# Patient Record
Sex: Male | Born: 1975 | Race: White | Hispanic: No | Marital: Married | Smoking: Former smoker
Health system: Southern US, Community
[De-identification: ages and names within clinical notes are randomized; demographics above are authoritative.]

## PROBLEM LIST (undated history)

## (undated) DIAGNOSIS — T7840XA Allergy, unspecified, initial encounter: Secondary | ICD-10-CM

## (undated) DIAGNOSIS — K8020 Calculus of gallbladder without cholecystitis without obstruction: Secondary | ICD-10-CM

## (undated) DIAGNOSIS — R519 Headache, unspecified: Secondary | ICD-10-CM

## (undated) DIAGNOSIS — K589 Irritable bowel syndrome without diarrhea: Secondary | ICD-10-CM

## (undated) DIAGNOSIS — Z800 Family history of malignant neoplasm of digestive organs: Secondary | ICD-10-CM

## (undated) DIAGNOSIS — F419 Anxiety disorder, unspecified: Secondary | ICD-10-CM

## (undated) DIAGNOSIS — B019 Varicella without complication: Secondary | ICD-10-CM

## (undated) DIAGNOSIS — J309 Allergic rhinitis, unspecified: Secondary | ICD-10-CM

## (undated) DIAGNOSIS — G8929 Other chronic pain: Secondary | ICD-10-CM

## (undated) DIAGNOSIS — E785 Hyperlipidemia, unspecified: Secondary | ICD-10-CM

## (undated) DIAGNOSIS — K219 Gastro-esophageal reflux disease without esophagitis: Secondary | ICD-10-CM

## (undated) HISTORY — PX: CHOLECYSTECTOMY: SHX55

## (undated) HISTORY — DX: Allergic rhinitis, unspecified: J30.9

## (undated) HISTORY — DX: Headache, unspecified: R51.9

## (undated) HISTORY — DX: Calculus of gallbladder without cholecystitis without obstruction: K80.20

## (undated) HISTORY — DX: Anxiety disorder, unspecified: F41.9

## (undated) HISTORY — PX: COLONOSCOPY: SHX174

## (undated) HISTORY — PX: ROTATOR CUFF REPAIR: SHX139

## (undated) HISTORY — DX: Family history of malignant neoplasm of digestive organs: Z80.0

## (undated) HISTORY — DX: Varicella without complication: B01.9

## (undated) HISTORY — DX: Irritable bowel syndrome without diarrhea: K58.9

## (undated) HISTORY — DX: Other chronic pain: G89.29

## (undated) HISTORY — DX: Hyperlipidemia, unspecified: E78.5

## (undated) HISTORY — PX: FOOT SURGERY: SHX648

## (undated) HISTORY — DX: Allergy, unspecified, initial encounter: T78.40XA

---

## 2005-04-26 ENCOUNTER — Emergency Department (HOSPITAL_COMMUNITY): Admission: EM | Admit: 2005-04-26 | Discharge: 2005-04-27 | Payer: Self-pay | Admitting: Emergency Medicine

## 2006-03-15 ENCOUNTER — Emergency Department (HOSPITAL_COMMUNITY): Admission: EM | Admit: 2006-03-15 | Discharge: 2006-03-15 | Payer: Self-pay | Admitting: Family Medicine

## 2007-05-10 ENCOUNTER — Ambulatory Visit (HOSPITAL_COMMUNITY): Admission: RE | Admit: 2007-05-10 | Discharge: 2007-05-10 | Payer: Self-pay | Admitting: Sports Medicine

## 2007-06-04 ENCOUNTER — Ambulatory Visit (HOSPITAL_BASED_OUTPATIENT_CLINIC_OR_DEPARTMENT_OTHER): Admission: RE | Admit: 2007-06-04 | Discharge: 2007-06-04 | Payer: Self-pay | Admitting: Orthopedic Surgery

## 2008-11-25 ENCOUNTER — Emergency Department (HOSPITAL_COMMUNITY): Admission: EM | Admit: 2008-11-25 | Discharge: 2008-11-25 | Payer: Self-pay | Admitting: Emergency Medicine

## 2008-12-09 ENCOUNTER — Ambulatory Visit (HOSPITAL_COMMUNITY): Admission: RE | Admit: 2008-12-09 | Discharge: 2008-12-09 | Payer: Self-pay | Admitting: Orthopedic Surgery

## 2009-03-23 ENCOUNTER — Ambulatory Visit (HOSPITAL_BASED_OUTPATIENT_CLINIC_OR_DEPARTMENT_OTHER): Admission: RE | Admit: 2009-03-23 | Discharge: 2009-03-23 | Payer: Self-pay | Admitting: Orthopedic Surgery

## 2010-06-20 ENCOUNTER — Encounter: Payer: Self-pay | Admitting: Orthopedic Surgery

## 2010-09-01 LAB — POCT HEMOGLOBIN-HEMACUE: Hemoglobin: 14.6 g/dL (ref 13.0–17.0)

## 2010-09-05 LAB — POCT RAPID STREP A (OFFICE): Streptococcus, Group A Screen (Direct): NEGATIVE

## 2010-10-11 NOTE — Op Note (Signed)
[name], [name]            ACCOUNT NO.:  [id number]   MEDICAL RECORD NO.:  [id number]          PATIENT TYPE:  AMB   LOCATION:  DSC                          FACILITY:  [facility]   PHYSICIAN:  [name], M.D. DATE OF BIRTH:  1975-06-03   DATE OF PROCEDURE:  06/04/2007  DATE OF DISCHARGE:                               OPERATIVE REPORT   PREOPERATIVE DIAGNOSIS:  Left shoulder partial labrum tear with possible  rotator cuff tear with impingement.   POSTOPERATIVE DIAGNOSES:  1. Left shoulder rotator cuff tear.  2. Left shoulder partial labrum tear with impingement.   OPERATION PERFORMED:  1. Left shoulder examination under anesthesia followed by      arthroscopically assisted rotator cuff repair using Arthrex suture      anchor x1.  2. Left shoulder debridement, partial labrum tear.  3. Left shoulder subacromial decompression.   SURGEON:  [name]. Thurston Kelly, M.D.   ASSISTANT:  [name], P.A.   ANESTHESIA:  General.   OPERATIVE TIME:  One hour.   COMPLICATIONS:  None.   INDICATIONS FOR PROCEDURE:  [name] is a 35 year old gentleman who has  had significant left shoulder pain for the past four to five months  increasing in nature with exam and MRI documenting possible labrum tear  with rotator cuff partial versus complete tear with impingement.  He has  failed conservative care and is now to undergo arthroscopy.   DESCRIPTION OF PROCEDURE:  [name] is brought to the operating room on  June 04, 2007 after an interscalene block was placed in the holding  room by anesthesia.  He was placed on the operating table in supine  position.  He received Ancef 1 g IV preoperatively for prophylaxis.  After being placed under general anesthesia, his left shoulder was  examined.  He had full range of motion of the shoulder with stable  ligamentous exam.  He was then placed in the beach chair position and  the shoulder and arm were prepped using sterile DuraPrep and  draped  using sterile technique.  Originally through a posterior arthroscopic  portal, the arthroscope with a pump attached was placed, through an  anterior portal and an arthroscopic probe was placed.  On initial  inspection, the articular cartilage in the glenohumeral joint was  intact.  Anterior labrum was intact.  Anterior inferior glenohumeral  ligament complex was intact.  Superior labrum showed mild fraying 25%  which was debrided but the biceps tendon anchor and biceps tendon was  intact.  The posterior labrum was intact.  The rotator cuff showed a  near complete tear of the supraspinatus 90%, partial tear of 30 to 40%  on the infraspinatus which was debrided, partial tear of the  subscapularis 30% which was debrided.  The teres minor was intact, the  inferior capsule recess was free of pathology.  Subacromial space was  entered and a lateral arthroscopic portal was made.  Moderately  thickened bursitis was resected.  Impingement was noted and the  subacromial decompression was carried out removing 6 to 8 mm of the  undersurface of the anterior, anterolateral and anteromedial acromion.  CA ligament release carried out as well.  The acromioclavicular joint  was not impinging on motion and thus was not resected.  The rotator cuff  tear was further debrided subacromially arthroscopically.  At this point  one Arthrex 5.5 mm suture anchor was placed in the greater tuberosity.  Each of the sutures in this anchor was passed in a mattress suture  technique through the rotator cuff tear and then tied down  arthroscopically with a firm and tight repair.  After this was done, the  shoulder could be brought through a full range of motion with no  impingement on the rotator cuff repair.  At this point it was felt that  all pathology had been satisfactorily addressed.  The instruments were  removed.  Portals were closed with 3-0 nylon suture and sterile  dressings and Kling applied and the  patient awakened and taken to  recovery in stable condition.   FOLLOWUP:  [name] will be followed as an outpatient on Percocet  and Robaxin with early physical therapy.  See him back in the office in  a week for sutures out and follow-up.      [name], M.D.  Electronically Signed     RAW/MEDQ  D:  06/04/2007  T:  06/04/2007  Job:  [id number]

## 2013-01-07 ENCOUNTER — Ambulatory Visit (INDEPENDENT_AMBULATORY_CARE_PROVIDER_SITE_OTHER): Payer: 59 | Admitting: Physician Assistant

## 2013-01-07 VITALS — BP 124/70 | HR 50 | Temp 98.0°F | Resp 16 | Ht 72.5 in | Wt 251.0 lb

## 2013-01-07 DIAGNOSIS — J209 Acute bronchitis, unspecified: Secondary | ICD-10-CM

## 2013-01-07 DIAGNOSIS — R05 Cough: Secondary | ICD-10-CM

## 2013-01-07 MED ORDER — ALBUTEROL SULFATE HFA 108 (90 BASE) MCG/ACT IN AERS: 2.0000 | INHALATION_SPRAY | RESPIRATORY_TRACT | Status: DC | PRN

## 2013-01-07 MED ORDER — BENZONATATE 100 MG PO CAPS: 100.0000 mg | ORAL_CAPSULE | Freq: Three times a day (TID) | ORAL | Status: DC | PRN

## 2013-01-07 MED ORDER — HYDROCODONE-HOMATROPINE 5-1.5 MG/5ML PO SYRP: 5.0000 mL | ORAL_SOLUTION | Freq: Three times a day (TID) | ORAL | Status: DC | PRN

## 2013-01-07 NOTE — Patient Instructions (Addendum)
Begin using the Tessalon Perles every 8 hours as needed for cough during the day.  Hycodan syrup for cough at night (this will likely make you sleepy).  Use the albuterol inhaler if needed for coughing spasms.  Make sure you are drinking plenty of fluids.  You may continue the Mucinex and Allegra.  Please let us know if any symptoms are worsening or not improving - i.e. Fever, worsening cough, shortness of breath, new symptoms.   Bronchitis Bronchitis is the body's way of reacting to injury and/or infection (inflammation) of the bronchi. Bronchi are the air tubes that extend from the windpipe into the lungs. If the inflammation becomes severe, it may cause shortness of breath. CAUSES  Inflammation may be caused by:  A virus.  Germs (bacteria).  Dust.  Allergens.  Pollutants and many other irritants. The cells lining the bronchial tree are covered with tiny hairs (cilia). These constantly beat upward, away from the lungs, toward the mouth. This keeps the lungs free of pollutants. When these cells become too irritated and are unable to do their job, mucus begins to develop. This causes the characteristic cough of bronchitis. The cough clears the lungs when the cilia are unable to do their job. Without either of these protective mechanisms, the mucus would settle in the lungs. Then you would develop pneumonia. Smoking is a common cause of bronchitis and can contribute to pneumonia. Stopping this habit is the single most important thing you can do to help yourself. TREATMENT   Your caregiver may prescribe an antibiotic if the cough is caused by bacteria. Also, medicines that open up your airways make it easier to breathe. Your caregiver may also recommend or prescribe an expectorant. It will loosen the mucus to be coughed up. Only take over-the-counter or prescription medicines for pain, discomfort, or fever as directed by your caregiver.  Removing whatever causes the problem (smoking, for  example) is critical to preventing the problem from getting worse.  Cough suppressants may be prescribed for relief of cough symptoms.  Inhaled medicines may be prescribed to help with symptoms now and to help prevent problems from returning.  For those with recurrent (chronic) bronchitis, there may be a need for steroid medicines. SEEK IMMEDIATE MEDICAL CARE IF:   During treatment, you develop more pus-like mucus (purulent sputum).  You have a fever.  Your baby is older than 3 months with a rectal temperature of 102 F (38.9 C) or higher.  Your baby is 79 months old or younger with a rectal temperature of 100.4 F (38 C) or higher.  You become progressively more ill.  You have increased difficulty breathing, wheezing, or shortness of breath. It is necessary to seek immediate medical care if you are elderly or sick from any other disease. MAKE SURE YOU:   Understand these instructions.  Will watch your condition.  Will get help right away if you are not doing well or get worse. Document Released: 05/15/2005 Document Revised: 08/07/2011 Document Reviewed: 03/24/2008 [facility] Patient Information 2014 [place], [place].

## 2013-01-07 NOTE — Progress Notes (Signed)
  Subjective:    Patient ID: [name], male    DOB: Dec 03, 1975, 37 y.o.   MRN: [id number]  HPI   [name] is a very pleasant 37 yr old male here with concern for 3 wks of cough.  Reports he had a little runny nose at the onset of illness - that has resolved but cough persists.  He denies fever or chills.  Cough is mostly non-productive - every now and then coughs up a little mucus.  Denies SOB or wheezing but does get in coughing fits. The cough is often worse at night, occasionally keeps him awake.  No hx of asthma or reflux.  Admits that there is "something going around at work."  Has used Data processing manager for symptoms with some relief.     Review of Systems  Constitutional: Negative for fever and chills.  HENT: Negative for ear pain, congestion, sore throat and rhinorrhea.   Respiratory: Positive for cough. Negative for chest tightness, shortness of breath and wheezing.   Cardiovascular: Negative.   Gastrointestinal: Negative.   Musculoskeletal: Negative.   Skin: Negative.   Neurological: Negative.        Objective:   Physical Exam  Vitals reviewed. Constitutional: He is oriented to person, place, and time. He appears well-developed and well-nourished. No distress.  HENT:  Head: Normocephalic and atraumatic.  Right Ear: Tympanic membrane and ear canal normal.  Left Ear: Tympanic membrane and ear canal normal.  Mouth/Throat: Uvula is midline, oropharynx is clear and moist and mucous membranes are normal.  Eyes: Conjunctivae are normal. No scleral icterus.  Neck: Neck supple.  Cardiovascular: Normal rate, regular rhythm and normal heart sounds.   Pulmonary/Chest: Effort normal and breath sounds normal. He has no wheezes. He has no rhonchi. He has no rales.  Increased cough with deep insp, but lungs CTA  Abdominal: Soft. There is no tenderness.  Lymphadenopathy:    He has no cervical adenopathy.  Neurological: He is alert and oriented to person, place, and time.   Skin: Skin is warm and dry.  Psychiatric: He has a normal mood and affect. His behavior is normal.        Assessment & Plan:  Acute bronchitis - Plan: benzonatate (TESSALON) 100 MG capsule, albuterol (PROVENTIL HFA;VENTOLIN HFA) 108 (90 BASE) MCG/ACT inhaler, HYDROcodone-homatropine (HYCODAN) 5-1.5 MG/5ML syrup  Cough - Plan: benzonatate (TESSALON) 100 MG capsule, albuterol (PROVENTIL HFA;VENTOLIN HFA) 108 (90 BASE) MCG/ACT inhaler, HYDROcodone-homatropine (HYCODAN) 5-1.5 MG/5ML syrup   [name] is a very pleasant 37 yr old male with cough x 3 wks.  Suspect bronchitis vs post-viral cough.  He is afebrile, and lungs are CTA.  Will try Tessalon during the day, Hycodan syrup at night.  Albuterol prn coughing spasms.  Continue Mucinex and Allegra.  Push fluids.  If any symptoms worsening or not improving, or if new symptoms develop, pt to RTC.

## 2014-11-12 ENCOUNTER — Ambulatory Visit (INDEPENDENT_AMBULATORY_CARE_PROVIDER_SITE_OTHER): Payer: 59 | Admitting: Family Medicine

## 2014-11-12 VITALS — BP 115/74 | HR 75 | Temp 97.8°F | Resp 18 | Wt 260.0 lb

## 2014-11-12 DIAGNOSIS — H109 Unspecified conjunctivitis: Secondary | ICD-10-CM | POA: Diagnosis not present

## 2014-11-12 MED ORDER — CIPROFLOXACIN HCL 0.3 % OP SOLN: OPHTHALMIC | Status: DC

## 2014-11-12 NOTE — Progress Notes (Signed)
Subjective:  This chart was scribed for Jose Ray, MD by [facility], medical scribe at [facility].The patient was seen in exam room 13 and the patient's care was started at 9:58 AM.   Patient ID: [name], male    DOB: Jul 18, 1975, 39 y.o.   MRN: [id number] Chief Complaint  Patient presents with  . Eye Pain    right   HPI HPI Comments: [name] is a 39 y.o. male who presents to [facility] complaining of right eye pain, redness, and itching with clear discharge; onset three days ago. Slightly more swelling than yesterday. He was playing with his daughter and his eye began itching. He has used OTC eye drops, which have not been helpful. He does not wear contacts or glasses. He has a three children, no sick contacts. He denies congestion, cough, sore throat, rhinorrhea, and visual disturbance. 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  There are no active problems to display for this patient.  Past Medical History  Diagnosis Date  . Allergy    Past Surgical History  Procedure Laterality Date  . Rotator cuff repair      both shoulders   No Known Allergies Prior to Admission medications   Not on File   History   Social History  . Marital Status: Married    Spouse Name: N/A  . Number of Children: N/A  . Years of Education: N/A   Occupational History  . Not on file.   Social History Main Topics  . Smoking status: Never Smoker   . Smokeless tobacco: Not on file  . Alcohol Use: No  . Drug Use: No  . Sexual Activity: Yes   Other Topics Concern  . Not on file   Social History Narrative   Review of Systems  HENT: Negative for congestion, rhinorrhea and sore throat.   Eyes: Positive for pain, discharge, redness and itching. Negative for visual disturbance.  Respiratory: Negative for cough.       Objective:  BP 115/74 mmHg  Pulse 75  Temp(Src) 97.8 F (36.6 C) (Oral)  Resp 18  Wt 260 lb (117.935 kg)  SpO2 98%    Visual Acuity Screening   Right eye Left eye Both eyes  Without correction: 20/20 20/20 20/15   With correction:      Physical Exam  Constitutional: He is oriented to person, place, and time. He appears well-developed and well-nourished. No distress.  HENT:  Head: Normocephalic and atraumatic.  Right Ear: Tympanic membrane, external ear and ear canal normal.  Left Ear: Tympanic membrane, external ear and ear canal normal.  Nose: No rhinorrhea.  Mouth/Throat: Oropharynx is clear and moist and mucous membranes are normal. No oropharyngeal exudate or posterior oropharyngeal erythema.  Eyes: Conjunctivae are normal. Pupils are equal, round, and reactive to light.  Sclera injection of the right eye especially at the inferior aspect.Soft tissue swelling of the outer upper and lower lids, lower greater than upper. Exudate of the canthi but clear water of the eye. No pain with EOMI testing.  Neck: Normal range of motion. Neck supple.  Cardiovascular: Normal rate, regular rhythm, normal heart sounds and intact distal pulses.  Exam reveals no gallop and no friction rub.   No murmur heard. Pulmonary/Chest: Effort normal and breath sounds normal. No respiratory distress. He has no wheezes. He has no rhonchi. He has no rales.  Abdominal: Soft. There is no tenderness.  Musculoskeletal: Normal range of motion.  Lymphadenopathy:    He has no cervical adenopathy.  Neurological: He is alert and oriented to person, place, and time.  Skin: Skin is warm and dry. No rash noted.  Psychiatric: He has a normal mood and affect. His behavior is normal.  Nursing note and vitals reviewed. Two drops of proparacaine to the right eye, lids were everted and  no foreign body present. No uptake with flourescein.     Assessment & Plan:   [name] is a 39 y.o. male Conjunctivitis of right eye - Plan: ciprofloxacin (CILOXAN) 0.3 % ophthalmic solution  -start ciloxan, rtc if increasing lid edema or erythema as  may need different tx for preseptal cellulitis - but does not appear to be significant lid involvement at this time. rtc precautions.   Meds ordered this encounter  Medications  . ciprofloxacin (CILOXAN) 0.3 % ophthalmic solution    Sig: Administer 1 drop to right eye, every 2 hours, while awake, for 2 days. Then 1 drop, every 4 hours, while awake, for the next 5 days.    Dispense:  5 mL    Refill:  0   Patient Instructions  Start antibiotic drops.  If not improving in next 3-4 days - return for recheck. Return to the clinic or go to the nearest emergency room if any of your symptoms worsen or new symptoms occur.   Bacterial Conjunctivitis Bacterial conjunctivitis, commonly called pink eye, is an inflammation of the clear membrane that covers the white part of the eye (conjunctiva). The inflammation can also happen on the underside of the eyelids. The blood vessels in the conjunctiva become inflamed, causing the eye to become red or pink. Bacterial conjunctivitis may spread easily from one eye to another and from person to person (contagious).  CAUSES  Bacterial conjunctivitis is caused by bacteria. The bacteria may come from your own skin, your upper respiratory tract, or from someone else with bacterial conjunctivitis. SYMPTOMS  The normally white color of the eye or the underside of the eyelid is usually pink or red. The pink eye is usually associated with irritation, tearing, and some sensitivity to light. Bacterial conjunctivitis is often associated with a thick, yellowish discharge from the eye. The discharge may turn into a crust on the eyelids overnight, which causes your eyelids to stick together. If a discharge is present, there may also be some blurred vision in the affected eye. DIAGNOSIS  Bacterial conjunctivitis is diagnosed by your caregiver through an eye exam and the symptoms that you report. Your caregiver looks for changes in the surface tissues of your eyes, which may point  to the specific type of conjunctivitis. A sample of any discharge may be collected on a cotton-tip swab if you have a severe case of conjunctivitis, if your cornea is affected, or if you keep getting repeat infections that do not respond to treatment. The sample will be sent to a lab to see if the inflammation is caused by a bacterial infection and to see if the infection will respond to antibiotic medicines. TREATMENT   Bacterial conjunctivitis is treated with antibiotics. Antibiotic eyedrops are most often used. However, antibiotic ointments are also available. Antibiotics pills are sometimes used. Artificial tears or eye washes may ease discomfort. HOME CARE INSTRUCTIONS   To ease discomfort, apply a cool, clean washcloth to your eye for 10-20 minutes, 3-4 times a day.  Gently wipe away any drainage from your eye with a warm, wet washcloth or a cotton ball.  Wash your hands  often with soap and water. Use paper towels to dry your hands.  Do not share towels or washcloths. This may spread the infection.  Change or wash your pillowcase every day.  You should not use eye makeup until the infection is gone.  Do not operate machinery or drive if your vision is blurred.  Stop using contact lenses. Ask your caregiver how to sterilize or replace your contacts before using them again. This depends on the type of contact lenses that you use.  When applying medicine to the infected eye, do not touch the edge of your eyelid with the eyedrop bottle or ointment tube. SEEK IMMEDIATE MEDICAL CARE IF:   Your infection has not improved within 3 days after beginning treatment.  You had yellow discharge from your eye and it returns.  You have increased eye pain.  Your eye redness is spreading.  Your vision becomes blurred.  You have a fever or persistent symptoms for more than 2-3 days.  You have a fever and your symptoms suddenly get worse.  You have facial pain, redness, or swelling. MAKE  SURE YOU:   Understand these instructions.  Will watch your condition.  Will get help right away if you are not doing well or get worse. Document Released: 05/15/2005 Document Revised: 09/29/2013 Document Reviewed: 10/16/2011 [facility] Patient Information 2015 [place], [place]. This information is not intended to replace advice given to you by your health care provider. Make sure you discuss any questions you have with your health care provider.    I personally performed the services described in this documentation, which was scribed in my presence. The recorded information has been reviewed and considered, and addended by me as needed.

## 2014-11-12 NOTE — Patient Instructions (Addendum)
Start antibiotic drops.  If not improving in next 3-4 days - return for recheck. Return to the clinic or go to the nearest emergency room if any of your symptoms worsen or new symptoms occur.   Bacterial Conjunctivitis Bacterial conjunctivitis, commonly called pink eye, is an inflammation of the clear membrane that covers the white part of the eye (conjunctiva). The inflammation can also happen on the underside of the eyelids. The blood vessels in the conjunctiva become inflamed, causing the eye to become red or pink. Bacterial conjunctivitis may spread easily from one eye to another and from person to person (contagious).  CAUSES  Bacterial conjunctivitis is caused by bacteria. The bacteria may come from your own skin, your upper respiratory tract, or from someone else with bacterial conjunctivitis. SYMPTOMS  The normally white color of the eye or the underside of the eyelid is usually pink or red. The pink eye is usually associated with irritation, tearing, and some sensitivity to light. Bacterial conjunctivitis is often associated with a thick, yellowish discharge from the eye. The discharge may turn into a crust on the eyelids overnight, which causes your eyelids to stick together. If a discharge is present, there may also be some blurred vision in the affected eye. DIAGNOSIS  Bacterial conjunctivitis is diagnosed by your caregiver through an eye exam and the symptoms that you report. Your caregiver looks for changes in the surface tissues of your eyes, which may point to the specific type of conjunctivitis. A sample of any discharge may be collected on a cotton-tip swab if you have a severe case of conjunctivitis, if your cornea is affected, or if you keep getting repeat infections that do not respond to treatment. The sample will be sent to a lab to see if the inflammation is caused by a bacterial infection and to see if the infection will respond to antibiotic medicines. TREATMENT   Bacterial  conjunctivitis is treated with antibiotics. Antibiotic eyedrops are most often used. However, antibiotic ointments are also available. Antibiotics pills are sometimes used. Artificial tears or eye washes may ease discomfort. HOME CARE INSTRUCTIONS   To ease discomfort, apply a cool, clean washcloth to your eye for 10-20 minutes, 3-4 times a day.  Gently wipe away any drainage from your eye with a warm, wet washcloth or a cotton ball.  Wash your hands often with soap and water. Use paper towels to dry your hands.  Do not share towels or washcloths. This may spread the infection.  Change or wash your pillowcase every day.  You should not use eye makeup until the infection is gone.  Do not operate machinery or drive if your vision is blurred.  Stop using contact lenses. Ask your caregiver how to sterilize or replace your contacts before using them again. This depends on the type of contact lenses that you use.  When applying medicine to the infected eye, do not touch the edge of your eyelid with the eyedrop bottle or ointment tube. SEEK IMMEDIATE MEDICAL CARE IF:   Your infection has not improved within 3 days after beginning treatment.  You had yellow discharge from your eye and it returns.  You have increased eye pain.  Your eye redness is spreading.  Your vision becomes blurred.  You have a fever or persistent symptoms for more than 2-3 days.  You have a fever and your symptoms suddenly get worse.  You have facial pain, redness, or swelling. MAKE SURE YOU:   Understand these instructions.  Will watch  your condition.  Will get help right away if you are not doing well or get worse. Document Released: 05/15/2005 Document Revised: 09/29/2013 Document Reviewed: 10/16/2011 [facility] Patient Information 2015 [place], [place]. This information is not intended to replace advice given to you by your health care provider. Make sure you discuss any questions you have with your health  care provider.

## 2017-09-27 DIAGNOSIS — R091 Pleurisy: Secondary | ICD-10-CM | POA: Diagnosis not present

## 2017-09-27 DIAGNOSIS — R0789 Other chest pain: Secondary | ICD-10-CM | POA: Diagnosis not present

## 2017-10-25 DIAGNOSIS — D225 Melanocytic nevi of trunk: Secondary | ICD-10-CM | POA: Diagnosis not present

## 2017-10-25 DIAGNOSIS — L821 Other seborrheic keratosis: Secondary | ICD-10-CM | POA: Diagnosis not present

## 2017-10-25 DIAGNOSIS — L814 Other melanin hyperpigmentation: Secondary | ICD-10-CM | POA: Diagnosis not present

## 2017-10-25 DIAGNOSIS — D1801 Hemangioma of skin and subcutaneous tissue: Secondary | ICD-10-CM | POA: Diagnosis not present

## 2017-10-26 DIAGNOSIS — M940 Chondrocostal junction syndrome [Tietze]: Secondary | ICD-10-CM | POA: Diagnosis not present

## 2018-03-27 DIAGNOSIS — Z23 Encounter for immunization: Secondary | ICD-10-CM | POA: Diagnosis not present

## 2018-03-27 DIAGNOSIS — Z0000 Encounter for general adult medical examination without abnormal findings: Secondary | ICD-10-CM | POA: Diagnosis not present

## 2018-03-27 DIAGNOSIS — Z13220 Encounter for screening for lipoid disorders: Secondary | ICD-10-CM | POA: Diagnosis not present

## 2018-03-27 LAB — LIPID PANEL
Cholesterol: 205 — AB (ref 0–200)
LDL Cholesterol: 125
Triglycerides: 180 — AB (ref 40–160)

## 2018-07-28 NOTE — Progress Notes (Signed)
[name] is a 43 y.o. male is here to [place].   Patient Care Team: Briscoe Deutscher, DO as PCP - General (Family Medicine)   History of Present Illness:   Abdominal Pain  This is a recurrent problem. The current episode started more than 1 month ago. The onset quality is sudden. The problem occurs intermittently. The problem has been waxing and waning. The pain is located in the RUQ. The pain is moderate. The quality of the pain is colicky. Pertinent negatives include no constipation, diarrhea, fever or hematochezia. Nothing aggravates the pain. The pain is relieved by nothing. He has tried nothing for the symptoms. His past medical history is significant for irritable bowel syndrome.   Mother passed from pancreatic cancer in her 56s. She was an alcoholic that also smoked.   Flu shot in October.   Daughter has strep.  Health Maintenance Due  Topic Date Due  . HIV Screening  05/31/1989   Depression screen PHQ 2/9 07/29/2018  Decreased Interest 0  Down, Depressed, Hopeless 0  PHQ - 2 Score 0  Altered sleeping 1  Tired, decreased energy 1  Change in appetite 1  Feeling bad or failure about yourself  0  Trouble concentrating 0  Moving slowly or fidgety/restless 0  Suicidal thoughts 0  PHQ-9 Score 3  Difficult doing work/chores Not difficult at all   PMHx, SurgHx, SocialHx, Medications, and Allergies were reviewed in the Visit Navigator and updated as appropriate.   Past Medical History:  Diagnosis Date  . Allergic rhinitis   . Family history of pancreatic cancer 07/29/2018  . Irritable bowel syndrome 07/29/2018     Past Surgical History:  Procedure Laterality Date  . ROTATOR CUFF REPAIR Bilateral      Family History  Problem Relation Age of Onset  . Cancer Mother   . Heart disease Father   . Cancer Father     Social History   Tobacco Use  . Smoking status: Never Smoker  . Smokeless tobacco: Never Used  Substance Use Topics  . Alcohol use: No   Alcohol/week: 1.0 standard drinks    Types: 1 Standard drinks or equivalent per week  . Drug use: No    Current Medications and Allergies   Current Outpatient Medications:  .  ELDERBERRY PO, Take by mouth., Disp: , Rfl:  .  Omega-3 Fatty Acids (FISH OIL) 1000 MG CAPS, Take by mouth., Disp: , Rfl:   No Known Allergies Review of Systems   Pertinent items are noted in the HPI. Otherwise, a complete ROS is negative.  Vitals   Vitals:   07/29/18 1046  BP: 120/68  Pulse: (!) 54  Temp: 97.9 F (36.6 C)  TempSrc: Oral  SpO2: 97%  Weight: 262 lb (118.8 kg)  Height: 6' (1.829 m)     Body mass index is 35.53 kg/m.  Physical Exam   Physical Exam Vitals signs and nursing note reviewed.  Constitutional:      General: He is not in acute distress.    Appearance: He is well-developed.  HENT:     Head: Normocephalic and atraumatic.     Right Ear: External ear normal.     Left Ear: External ear normal.     Nose: Nose normal.  Eyes:     Conjunctiva/sclera: Conjunctivae normal.     Pupils: Pupils are equal, round, and reactive to light.  Neck:     Musculoskeletal: Normal range of motion and neck supple.  Cardiovascular:  Rate and Rhythm: Normal rate and regular rhythm.     Heart sounds: Normal heart sounds.  Pulmonary:     Effort: Pulmonary effort is normal.     Breath sounds: Normal breath sounds.  Abdominal:     General: Bowel sounds are normal.     Palpations: Abdomen is soft.     Tenderness: There is abdominal tenderness in the right upper quadrant.  Musculoskeletal: Normal range of motion.  Skin:    General: Skin is warm and dry.  Neurological:     Mental Status: He is alert and oriented to person, place, and time.  Psychiatric:        Behavior: Behavior normal.        Thought Content: Thought content normal.        Judgment: Judgment normal.    Assessment and Plan   [facility] was seen today for establish care.  Diagnoses and all orders for this  visit:  RUQ pain -     CBC with Differential/Platelet -     Comprehensive metabolic panel -     Lipase -     US ABDOMEN LIMITED RUQ; Future  Family history of pancreatic cancer -     Ambulatory referral to Genetics  Irritable bowel syndrome, unspecified type  Hyperglycemia -     Hemoglobin A1c   . Orders and follow up as documented in [place], reviewed diet, exercise and weight control, cardiovascular risk and specific lipid/LDL goals reviewed, reviewed medications and side effects in detail.  . Reviewed expectations re: course of current medical issues. . Outlined signs and symptoms indicating need for more acute intervention. . Patient verbalized understanding and all questions were answered. . Patient received an After Visit Summary.  Briscoe Deutscher, DO [place], [facility] [place] [facility] 07/29/2018

## 2018-07-29 ENCOUNTER — Ambulatory Visit: Payer: BLUE CROSS/BLUE SHIELD | Admitting: Family Medicine

## 2018-07-29 ENCOUNTER — Encounter: Payer: Self-pay | Admitting: Family Medicine

## 2018-07-29 VITALS — BP 120/68 | HR 54 | Temp 97.9°F | Ht 72.0 in | Wt 262.0 lb

## 2018-07-29 DIAGNOSIS — R1011 Right upper quadrant pain: Secondary | ICD-10-CM | POA: Diagnosis not present

## 2018-07-29 DIAGNOSIS — Z800 Family history of malignant neoplasm of digestive organs: Secondary | ICD-10-CM

## 2018-07-29 DIAGNOSIS — R739 Hyperglycemia, unspecified: Secondary | ICD-10-CM

## 2018-07-29 DIAGNOSIS — K589 Irritable bowel syndrome without diarrhea: Secondary | ICD-10-CM

## 2018-07-29 DIAGNOSIS — Z0000 Encounter for general adult medical examination without abnormal findings: Secondary | ICD-10-CM | POA: Diagnosis not present

## 2018-07-29 HISTORY — DX: Family history of malignant neoplasm of digestive organs: Z80.0

## 2018-07-29 HISTORY — DX: Irritable bowel syndrome, unspecified: K58.9

## 2018-07-29 LAB — COMPREHENSIVE METABOLIC PANEL
ALT: 37 U/L (ref 0–53)
AST: 23 U/L (ref 0–37)
Albumin: 4.6 g/dL (ref 3.5–5.2)
Alkaline Phosphatase: 47 U/L (ref 39–117)
BUN: 17 mg/dL (ref 6–23)
CO2: 29 mEq/L (ref 19–32)
Calcium: 9.3 mg/dL (ref 8.4–10.5)
Chloride: 104 mEq/L (ref 96–112)
Creatinine, Ser: 0.95 mg/dL (ref 0.40–1.50)
GFR: 86.06 mL/min (ref >60.00)
Glucose, Bld: 86 mg/dL (ref 70–99)
Potassium: 4.2 mEq/L (ref 3.5–5.1)
Sodium: 137 mEq/L (ref 135–145)
Total Bilirubin: 0.5 mg/dL (ref 0.2–1.2)
Total Protein: 7.1 g/dL (ref 6.0–8.3)

## 2018-07-29 LAB — CBC WITH DIFFERENTIAL/PLATELET
Basophils Absolute: 0.1 10*3/uL (ref 0.0–0.1)
Basophils Relative: 1 % (ref 0.0–3.0)
Eosinophils Absolute: 0.3 10*3/uL (ref 0.0–0.7)
Eosinophils Relative: 3.6 % (ref 0.0–5.0)
HCT: 40.5 % (ref 39.0–52.0)
Hemoglobin: 13.9 g/dL (ref 13.0–17.0)
Lymphocytes Relative: 35.6 % (ref 12.0–46.0)
Lymphs Abs: 2.5 10*3/uL (ref 0.7–4.0)
MCHC: 34.3 g/dL (ref 30.0–36.0)
MCV: 88.9 fl (ref 78.0–100.0)
Monocytes Absolute: 0.5 10*3/uL (ref 0.1–1.0)
Monocytes Relative: 6.6 % (ref 3.0–12.0)
Neutro Abs: 3.8 10*3/uL (ref 1.4–7.7)
Neutrophils Relative %: 53.2 % (ref 43.0–77.0)
Platelets: 216 10*3/uL (ref 150.0–400.0)
RBC: 4.55 Mil/uL (ref 4.22–5.81)
RDW: 13 % (ref 11.5–15.5)
WBC: 7.1 10*3/uL (ref 4.0–10.5)

## 2018-07-29 LAB — HEMOGLOBIN A1C: Hgb A1c MFr Bld: 5.1 % (ref 4.6–6.5)

## 2018-07-29 LAB — LIPASE: Lipase: 26 U/L (ref 11.0–59.0)

## 2018-08-06 ENCOUNTER — Encounter: Payer: Self-pay | Admitting: Physician Assistant

## 2018-08-07 ENCOUNTER — Encounter: Payer: Self-pay | Admitting: Family Medicine

## 2018-08-14 ENCOUNTER — Ambulatory Visit
Admission: RE | Admit: 2018-08-14 | Discharge: 2018-08-14 | Disposition: A | Discharge status: Home / Self Care | Payer: BLUE CROSS/BLUE SHIELD | Source: Ambulatory Visit | Attending: Family Medicine | Admitting: Family Medicine

## 2018-08-14 DIAGNOSIS — R1011 Right upper quadrant pain: Secondary | ICD-10-CM

## 2018-08-14 DIAGNOSIS — K8020 Calculus of gallbladder without cholecystitis without obstruction: Secondary | ICD-10-CM | POA: Diagnosis not present

## 2018-08-15 ENCOUNTER — Other Ambulatory Visit: Payer: Self-pay

## 2018-08-15 DIAGNOSIS — K819 Cholecystitis, unspecified: Secondary | ICD-10-CM

## 2018-09-20 ENCOUNTER — Other Ambulatory Visit: Payer: Self-pay

## 2018-09-23 ENCOUNTER — Inpatient Hospital Stay: Payer: BLUE CROSS/BLUE SHIELD | Attending: Oncology | Admitting: Licensed Clinical Social Worker

## 2018-09-23 ENCOUNTER — Encounter: Payer: Self-pay | Admitting: Licensed Clinical Social Worker

## 2018-09-23 DIAGNOSIS — Z800 Family history of malignant neoplasm of digestive organs: Secondary | ICD-10-CM

## 2018-09-23 DIAGNOSIS — Z7183 Encounter for nonprocreative genetic counseling: Secondary | ICD-10-CM

## 2018-09-23 NOTE — Progress Notes (Signed)
REFERRING PROVIDER: Briscoe Deutscher, [place],  [address]  PRIMARY PROVIDER:  Briscoe Deutscher, DO  PRIMARY REASON FOR VISIT:  1. Family history of pancreatic cancer    I connected with [name] at 10:01 AM by [facility] video conference and verified that I am speaking with the correct person using two identifiers. I discussed the limitations, risks, security and privacy concerns of performing an evaluation and management service by Webex and the availability of in person appointments. I also discussed with the patient that there may be a charge related to this service. The patient expressed understanding and agreed to proceed.  Patient's Location: Home Genetic Counselor location: Home  HISTORY OF PRESENT ILLNESS:   [name], a 43 y.o. male, was seen for a [place] cancer genetics consultation at the request of Dr. Juleen China due to a family history of pancreatic cancer.  [name] presents to clinic today to discuss the possibility of a hereditary predisposition to cancer, genetic testing, and to further clarify his future cancer risks, as well as potential cancer risks for family members.    [name] is a 43 y.o. male with no personal history of cancer.  He has not had a colonoscopy. He has a history of tobacco use for approximately 8 years and drinks occasionally. He reports no exposures to excessive radiation.   Past Medical History:  Diagnosis Date   Allergic rhinitis    Chicken pox    Family history of pancreatic cancer 07/29/2018   Family history of pancreatic cancer    Hyperlipidemia    Irritable bowel syndrome 07/29/2018    Past Surgical History:  Procedure Laterality Date   ROTATOR CUFF REPAIR Bilateral     Social History   Socioeconomic History   Marital status: Married    Spouse name: Not on file   Number of children: Not on file   Years of education: Not on file   Highest education level: Not on file  Occupational History      Employer: [employer]    Comment: Finance  Social Needs   Financial resource strain: Not on file   Food insecurity:    Worry: Not on file    Inability: Not on file   Transportation needs:    Medical: Not on file    Non-medical: Not on file  Tobacco Use   Smoking status: Never Smoker   Smokeless tobacco: Never Used  Substance and Sexual Activity   Alcohol use: Yes    Alcohol/week: 1.0 standard drinks    Types: 1 Standard drinks or equivalent per week   Drug use: No   Sexual activity: Yes    Partners: Female  Lifestyle   Physical activity:    Days per week: Not on file    Minutes per session: Not on file   Stress: Not on file  Relationships   Social connections:    Talks on phone: Not on file    Gets together: Not on file    Attends religious service: Not on file    Active member of club or organization: Not on file    Attends meetings of clubs or organizations: Not on file    Relationship status: Not on file  Other Topics Concern   Not on file  Social History Narrative   Not on file     FAMILY HISTORY:  We obtained a detailed, 4-generation family history.  Significant diagnoses are listed below: Family History  Problem Relation Age of Onset  Alcohol abuse Mother    Pancreatic cancer Mother 84   Early death Mother    Heart disease Father    Cancer Father        Lung   Alcohol abuse Father    Hypertension Father    Hyperlipidemia Father    Alcohol abuse Sister    Stroke Maternal Grandfather    Non-Hodgkin's lymphoma Paternal Uncle 62   [name] has one son, 74, and two daughters, 55 and 4. He has one sister who is living at 42 with no cancer history, and an adopted brother.   [name] mother was diagnosed with pancreatic cancer at 79 and died at 61. The patient has one maternal aunt who is living at 79. He does not have maternal cousins. His maternal grandfather died of a stroke in his late 59s. His maternal grandmother died  at 50, no cancers.  [name] father was diagnosed with lung cancer in his 24s and died at 61. He did have a history of smoking. [name] has 2 paternal aunts and 1 paternal uncle. His uncle has been diagnosed with non-hodgkins lymphoma at 7 and is living at 41. No cancers in paternal cousins. He has limited information about his paternal grandparents, he does not believe they had cancer.   [name] is unaware of previous family history of genetic testing for hereditary cancer risks. Patient's maternal ancestors are of [place] descent, and paternal ancestors are of [place] descent. There is no reported Ashkenazi Jewish ancestry. There is no known consanguinity.  GENETIC COUNSELING ASSESSMENT: [name] is a 43 y.o. male with a family history which is somewhat suggestive of a hereditary cancer syndrome. We, therefore, discussed and recommended the following at today's visit.   DISCUSSION: We discussed that 5-10% of pancreatic cancer is hereditary, with most cases associated with BRCA2.  There are other genes that can be associated with hereditary pancreatic cancer syndromes as well. We reviewed the characteristics, features and inheritance patterns of hereditary cancer syndromes. We also discussed genetic testing, including the appropriate family members to test, the process of testing, insurance coverage and turn-around-time for results. We discussed the implications of a negative, positive and/or variant of uncertain significant result. We recommended Mr. Buda pursue genetic testing for the 38 gene panel.   The Multi-Cancer Panel offered by [facility] includes sequencing and/or deletion duplication testing of the following 85 genes: AIP, ALK, APC, ATM, AXIN2,BAP1,  BARD1, BLM, BMPR1A, BRCA1, BRCA2, BRIP1, CASR, CDC73, CDH1, CDK4, CDKN1B, CDKN1C, CDKN2A (p14ARF), CDKN2A (p16INK4a), CEBPA, CHEK2, CTNNA1, DICER1, DIS3L2, EGFR (c.2369C>T, p.Thr790Met variant only), EPCAM (Deletion/duplication  testing only), FH, FLCN, GATA2, GPC3, GREM1 (Promoter region deletion/duplication testing only), HOXB13 (c.251G>A, p.Gly84Glu), HRAS, KIT, MAX, MEN1, MET, MITF (c.952G>A, p.Glu318Lys variant only), MLH1, MSH2, MSH3, MSH6, MUTYH, NBN, NF1, NF2, NTHL1, PALB2, PDGFRA, PHOX2B, PMS2, POLD1, POLE, POT1, PRKAR1A, PTCH1, PTEN, RAD50, RAD51C, RAD51D, RB1, RECQL4, RET,RNF43, RUNX1, SDHAF2, SDHA (sequence changes only), SDHB, SDHC, SDHD, SMAD4, SMARCA4, SMARCB1, SMARCE1, STK11, SUFU, TERC, TERT, TMEM127, TP53, TSC1, TSC2, VHL, WRN and WT1.   Based on [name] family history of pancreatic cancer, he meets medical criteria for genetic testing. Despite that he meets criteria, he may still have an out of pocket cost. The lab will notify him of an OOP if any.  We discussed that some people do not want to undergo genetic testing due to fear of genetic discrimination.  A federal law called the Genetic Information Non-Discrimination Act (GINA) of 2008 helps protect individuals against genetic  discrimination based on their genetic test results.  It impacts both health insurance and employment.  For health insurance, it protects against increased premiums, being kicked off insurance or being forced to take a test in order to be insured.  For employment it protects against hiring, firing and promoting decisions based on genetic test results.  Health status due to a cancer diagnosis is not protected under GINA.  This law does not protect life insurance, disability insurance, or other types of insurance.   In order to estimate his chance of having a gene mutation, we used statistical model PancPro that considers his personal medical history, family history and ancestry.  Because each model is different, there can be a lot of variability in the risks they give.  Therefore, these numbers must be considered a rough range and not a precise risk of having a mutation.  These models estimate that she has approximately a 10.02% chance  of having a mutation. Based on this assessment of his family and personal history, genetic testing is recommended.    PLAN: After considering the risks, benefits, and limitations, [name] provided informed consent to pursue genetic testing. A saliva kit will be shipped to his house, which he will then ship to [facility] for analysis of the Multi-Cancer Panel. Results should be available within approximately 2-3 weeks' time, at which point they will be disclosed by telephone to [name], as will any additional recommendations warranted by these results. [name] will receive a summary of his genetic counseling visit and a copy of his results once available. This information will also be available in Epic.   Based on [name] family history, we recommended his sister have genetic counseling and testing regardless of his results. [name] will let us know if we can be of any assistance in coordinating genetic counseling and/or testing for this family member.   Lastly, we encouraged [name] to remain in contact with cancer genetics annually so that we can continuously update the family history and inform him of any changes in cancer genetics and testing that may be of benefit for this family.   [name] questions were answered to his satisfaction today. Our contact information was provided should additional questions or concerns arise. Thank you for the referral and allowing [place] to share in the care of your patient.   Faith Rogue, MS Genetic Counselor [email or web address] Phone: [phone]  The patient was seen for a total of 31 minutes in virtual genetic counseling.  Dr. Grayland Ormond was available for discussion regarding this case. A genetic counseling student, Hughie Closs, was present and participated in this case.

## 2018-10-15 ENCOUNTER — Encounter: Payer: Self-pay | Admitting: Licensed Clinical Social Worker

## 2018-10-15 ENCOUNTER — Ambulatory Visit: Payer: Self-pay | Admitting: Licensed Clinical Social Worker

## 2018-10-15 ENCOUNTER — Telehealth: Payer: Self-pay | Admitting: Licensed Clinical Social Worker

## 2018-10-15 DIAGNOSIS — Z1379 Encounter for other screening for genetic and chromosomal anomalies: Secondary | ICD-10-CM | POA: Insufficient documentation

## 2018-10-15 DIAGNOSIS — Z800 Family history of malignant neoplasm of digestive organs: Secondary | ICD-10-CM

## 2018-10-15 NOTE — Progress Notes (Signed)
HPI:  [name] was previously seen in [facility] due to a family history of pancreatic cancer and concerns regarding a hereditary predisposition to cancer. Please refer to our prior cancer genetics clinic note for more information regarding our discussion, assessment and recommendations, at the time. [name] recent genetic test results were disclosed to him, as were recommendations warranted by these results. These results and recommendations are discussed in more detail below.  CANCER HISTORY:   No history exists.    FAMILY HISTORY:  We obtained a detailed, 4-generation family history.  Significant diagnoses are listed below: Family History  Problem Relation Age of Onset   Alcohol abuse Mother    Pancreatic cancer Mother 71   Early death Mother    Heart disease Father    Cancer Father        Lung   Alcohol abuse Father    Hypertension Father    Hyperlipidemia Father    Alcohol abuse Sister    Stroke Maternal Grandfather    Non-Hodgkin's lymphoma Paternal Uncle 19    [name] has one son, 66, and two daughters, 73 and 4. He has one sister who is living at 28 with no cancer history, and an adopted brother.   [name] mother was diagnosed with pancreatic cancer at 66 and died at 98. The patient has one maternal aunt who is living at 49. He does not have maternal cousins. His maternal grandfather died of a stroke in his late 60s. His maternal grandmother died at 42, no cancers.  [name] father was diagnosed with lung cancer in his 45s and died at 36. He did have a history of smoking. [name] has 2 paternal aunts and 1 paternal uncle. His uncle has been diagnosed with non-hodgkins lymphoma at 36 and is living at 16. No cancers in paternal cousins. He has limited information about his paternal grandparents, he does not believe they had cancer.   [name] is unaware of previous family history of genetic testing for  hereditary cancer risks. Patient's maternal ancestors are of [place] descent, and paternal ancestors are of [place] descent. There is no reported Ashkenazi Jewish ancestry. There is no known consanguinity.  GENETIC TEST RESULTS: Genetic testing reported out on 10/14/2018 through the [facility] Multi- cancer panel found no pathogenic mutations.   The Multi-Cancer Panel offered by [facility] includes sequencing and/or deletion duplication testing of the following 85 genes: AIP, ALK, APC, ATM, AXIN2,BAP1,  BARD1, BLM, BMPR1A, BRCA1, BRCA2, BRIP1, CASR, CDC73, CDH1, CDK4, CDKN1B, CDKN1C, CDKN2A (p14ARF), CDKN2A (p16INK4a), CEBPA, CHEK2, CTNNA1, DICER1, DIS3L2, EGFR (c.2369C>T, p.Thr790Met variant only), EPCAM (Deletion/duplication testing only), FH, FLCN, GATA2, GPC3, GREM1 (Promoter region deletion/duplication testing only), HOXB13 (c.251G>A, p.Gly84Glu), HRAS, KIT, MAX, MEN1, MET, MITF (c.952G>A, p.Glu318Lys variant only), MLH1, MSH2, MSH3, MSH6, MUTYH, NBN, NF1, NF2, NTHL1, PALB2, PDGFRA, PHOX2B, PMS2, POLD1, POLE, POT1, PRKAR1A, PTCH1, PTEN, RAD50, RAD51C, RAD51D, RB1, RECQL4, RET,RNF43, RUNX1, SDHAF2, SDHA (sequence changes only), SDHB, SDHC, SDHD, SMAD4, SMARCA4, SMARCB1, SMARCE1, STK11, SUFU, TERC, TERT, TMEM127, TP53, TSC1, TSC2, VHL, WRN and WT1.   The test report has been scanned into EPIC and is located under the Molecular Pathology section of the Results Review tab.  A portion of the result report is included below for reference.    We discussed with [name] that because current genetic testing is not perfect, it is possible there may be a gene mutation in one of these genes that current testing cannot detect, but that  chance is small.  We also discussed, that there could be another gene that has not yet been discovered, or that we have not yet tested, that is responsible for the cancer diagnoses in the family. It is also possible there is a hereditary cause for the cancer in the family that Mr.  Kelly did not inherit and therefore was not identified in his testing.  Therefore, it is important to remain in touch with cancer genetics in the future so that we can continue to offer [name] the most up to date genetic testing.   ADDITIONAL GENETIC TESTING: We discussed with [name] that his genetic testing was fairly extensive.  If there are genes identified to increase cancer risk that can be analyzed in the future, we would be happy to discuss and coordinate this testing at that time.    CANCER SCREENING RECOMMENDATIONS: [name] test result is considered negative (normal).  This means that we have not identified a hereditary cause for his family history of cancer at this time.   While reassuring, this does not definitively rule out a hereditary predisposition to cancer. It is still possible that there could be genetic mutations that are undetectable by current technology. There could be genetic mutations in genes that have not been tested or identified to increase cancer risk.  Therefore, it is recommended he continue to follow the cancer management and screening guidelines provided by his primary healthcare provider.   An individual's cancer risk and medical management are not determined by genetic test results alone. Overall cancer risk assessment incorporates additional factors, including personal medical history, family history, and any available genetic information that may result in a personalized plan for cancer prevention and surveillance  RECOMMENDATIONS FOR FAMILY MEMBERS:  Relatives in this family might be at some increased risk of developing cancer, over the general population risk, simply due to the family history of cancer.  We recommended women in this family have a yearly mammogram beginning at age 37, or 66 years younger than the earliest onset of cancer, an annual clinical breast exam, and perform monthly breast self-exams. Women in this family should also have a  gynecological exam as recommended by their primary provider. All family members should have a colonoscopy by age 83.  It is also possible there is a hereditary cause for the cancer in [name] family that he did not inherit and therefore was not identified in him.  Based on [name] family history, we recommended his sister and maternal relatives have genetic counseling and testing. [name] will let us know if we can be of any assistance in coordinating genetic counseling and/or testing for these family members.   FOLLOW-UP: Lastly, we discussed with [name] that cancer genetics is a rapidly advancing field and it is possible that new genetic tests will be appropriate for him and/or his family members in the future. We encouraged him to remain in contact with cancer genetics on an annual basis so we can update his personal and family histories and let him know of advances in cancer genetics that may benefit this family.   Our contact number was provided. [name] questions were answered to his satisfaction, and he knows he is welcome to call us at anytime with additional questions or concerns.   Faith Rogue, MS Genetic Counselor [email or web address] Phone: [phone]

## 2018-10-15 NOTE — Telephone Encounter (Signed)
Revealed negative genetic testing. This normal result is reassuring.  It is unlikely that there is an increased risk of cancer due to a mutation in one of these genes.  However, genetic testing is not perfect, and cannot definitively rule out a hereditary cause.  It will be important for him to keep in contact with genetics to learn if any additional testing may be needed in the future. We recommended his sister and other maternal relatives have genetic counseling and testing.

## 2019-03-10 DIAGNOSIS — S62658A Nondisplaced fracture of medial phalanx of other finger, initial encounter for closed fracture: Secondary | ICD-10-CM | POA: Diagnosis not present

## 2019-04-01 ENCOUNTER — Encounter: Payer: BLUE CROSS/BLUE SHIELD | Admitting: Family Medicine

## 2019-08-12 DIAGNOSIS — Z03818 Encounter for observation for suspected exposure to other biological agents ruled out: Secondary | ICD-10-CM | POA: Diagnosis not present

## 2019-08-12 DIAGNOSIS — J069 Acute upper respiratory infection, unspecified: Secondary | ICD-10-CM | POA: Diagnosis not present

## 2019-09-02 ENCOUNTER — Emergency Department (HOSPITAL_COMMUNITY)
Admission: EM | Admit: 2019-09-02 | Discharge: 2019-09-02 | Disposition: A | Discharge status: Home / Self Care | Payer: BC Managed Care – PPO | Attending: Emergency Medicine | Admitting: Emergency Medicine

## 2019-09-02 ENCOUNTER — Encounter (HOSPITAL_COMMUNITY): Payer: Self-pay | Admitting: Emergency Medicine

## 2019-09-02 ENCOUNTER — Emergency Department (HOSPITAL_COMMUNITY): Payer: BC Managed Care – PPO

## 2019-09-02 DIAGNOSIS — R1011 Right upper quadrant pain: Secondary | ICD-10-CM | POA: Diagnosis not present

## 2019-09-02 DIAGNOSIS — K8020 Calculus of gallbladder without cholecystitis without obstruction: Secondary | ICD-10-CM | POA: Diagnosis not present

## 2019-09-02 DIAGNOSIS — Z79899 Other long term (current) drug therapy: Secondary | ICD-10-CM | POA: Insufficient documentation

## 2019-09-02 LAB — COMPREHENSIVE METABOLIC PANEL
ALT: 42 U/L (ref 0–44)
AST: 28 U/L (ref 15–41)
Albumin: 4.3 g/dL (ref 3.5–5.0)
Alkaline Phosphatase: 46 U/L (ref 38–126)
Anion gap: 12 (ref 5–15)
BUN: 24 mg/dL — ABNORMAL HIGH (ref 6–20)
CO2: 23 mmol/L (ref 22–32)
Calcium: 9.4 mg/dL (ref 8.9–10.3)
Chloride: 103 mmol/L (ref 98–111)
Creatinine, Ser: 1.43 mg/dL — ABNORMAL HIGH (ref 0.61–1.24)
GFR calc Af Amer: >60 mL/min (ref >60)
GFR calc non Af Amer: 59 mL/min — ABNORMAL LOW (ref >60)
Glucose, Bld: 74 mg/dL (ref 70–99)
Potassium: 4.4 mmol/L (ref 3.5–5.1)
Sodium: 138 mmol/L (ref 135–145)
Total Bilirubin: 0.3 mg/dL (ref 0.3–1.2)
Total Protein: 6.9 g/dL (ref 6.5–8.1)

## 2019-09-02 LAB — URINALYSIS, ROUTINE W REFLEX MICROSCOPIC
Bilirubin Urine: NEGATIVE
Glucose, UA: NEGATIVE mg/dL
Hgb urine dipstick: NEGATIVE
Ketones, ur: NEGATIVE mg/dL
Leukocytes,Ua: NEGATIVE
Nitrite: NEGATIVE
Protein, ur: NEGATIVE mg/dL
Specific Gravity, Urine: 1.02 (ref 1.005–1.030)
pH: 5 (ref 5.0–8.0)

## 2019-09-02 LAB — CBC
HCT: 42.4 % (ref 39.0–52.0)
Hemoglobin: 14.7 g/dL (ref 13.0–17.0)
MCH: 31.9 pg (ref 26.0–34.0)
MCHC: 34.7 g/dL (ref 30.0–36.0)
MCV: 92 fL (ref 80.0–100.0)
Platelets: 209 10*3/uL (ref 150–400)
RBC: 4.61 MIL/uL (ref 4.22–5.81)
RDW: 12.1 % (ref 11.5–15.5)
WBC: 7.9 10*3/uL (ref 4.0–10.5)
nRBC: 0 % (ref 0.0–0.2)

## 2019-09-02 LAB — TROPONIN I (HIGH SENSITIVITY): Troponin I (High Sensitivity): 3 ng/L (ref <18)

## 2019-09-02 LAB — LIPASE, BLOOD: Lipase: 41 U/L (ref 11–51)

## 2019-09-02 NOTE — ED Notes (Signed)
US at bedside

## 2019-09-02 NOTE — ED Triage Notes (Signed)
Per pt, states he has a history of gallstones-states increased pain that last over a hour-per GI told him top come to ED for eval-states pain has subsided, no N/V/D

## 2019-09-02 NOTE — Discharge Instructions (Signed)
Please follow up with general surgery as directed. Please return to the ER sooner if you have any new or worsening symptoms, or if you have any of the following symptoms:  Abdominal pain that does not go away.  You have a fever.  You keep throwing up (vomiting).  The pain is felt only in portions of the abdomen. Pain in the right side could possibly be appendicitis. In an adult, pain in the left lower portion of the abdomen could be colitis or diverticulitis.  You pass bloody or black tarry stools.  There is bright red blood in the stool.  The constipation stays for more than 4 days.  There is belly (abdominal) or rectal pain.  You do not seem to be getting better.  You have any questions or concerns.

## 2019-09-02 NOTE — ED Provider Notes (Signed)
[facility] DEPT Provider Note   CSN: [id number] Arrival date & time: 09/02/19  1821     History Chief Complaint  Patient presents with  . Abdominal Pain    [name] is a 44 y.o. male.  HPI   Patient is a 44 year old male with a history of hyperlipidemia, IBS, who presents to the emergency department today for evaluation of abdominal pain that started around 4:00PM. States pain located to the RUQ and radiated to the back. Pain felt sharp and lasted for several hours. His sxs have improved somewhat since onset. Currently rates pain 2-3/10.  He has had similar episodes in the past and was diagnosed with gallstones. States the sxs today felt similar but seemed to last longer than normal.  States he had smoked boston butt roast for lunch.  Denies any associated nausea, vomiting, diarrhea. Last BM was today just PTA. Denies fevers, chest pain, or shortness of breath.   States he contacted his PCP office who advised him to come to the ED.   Past Medical History:  Diagnosis Date  . Allergic rhinitis   . Chicken pox   . Family history of pancreatic cancer 07/29/2018  . Family history of pancreatic cancer   . Hyperlipidemia   . Irritable bowel syndrome 07/29/2018    Patient Active Problem List   Diagnosis Date Noted  . Genetic testing 10/15/2018  . RUQ pain 07/29/2018  . Family history of pancreatic cancer 07/29/2018  . Irritable bowel syndrome 07/29/2018    Past Surgical History:  Procedure Laterality Date  . ROTATOR CUFF REPAIR Bilateral        Family History  Problem Relation Age of Onset  . Alcohol abuse Mother   . Pancreatic cancer Mother 87  . Early death Mother   . Heart disease Father   . Cancer Father        Lung  . Alcohol abuse Father   . Hypertension Father   . Hyperlipidemia Father   . Alcohol abuse Sister   . Stroke Maternal Grandfather   . Non-Hodgkin's lymphoma Paternal Uncle 39    Social History   Tobacco  Use  . Smoking status: Never Smoker  . Smokeless tobacco: Never Used  Substance Use Topics  . Alcohol use: Yes    Alcohol/week: 1.0 standard drinks    Types: 1 Standard drinks or equivalent per week  . Drug use: No    Home Medications Prior to Admission medications   Medication Sig Start Date End Date Taking? Authorizing Provider  fexofenadine (ALLEGRA) 180 MG tablet Take 180 mg by mouth daily.   Yes [provider]  ibuprofen (ADVIL) 200 MG tablet Take 200 mg by mouth every 6 (six) hours as needed for mild pain.   Yes [provider]  Omega-3 Fatty Acids (FISH OIL) 1000 MG CAPS Take 1 capsule by mouth daily.    Yes [provider]  Turmeric (QC TUMERIC COMPLEX PO) Take 1 capsule by mouth daily.   Yes [provider]    Allergies    Patient has no known allergies.  Review of Systems   Review of Systems  Constitutional: Negative for chills and fever.  HENT: Negative for ear pain and sore throat.   Eyes: Negative for visual disturbance.  Respiratory: Negative for cough and shortness of breath.   Cardiovascular: Negative for chest pain.  Gastrointestinal: Positive for abdominal pain. Negative for constipation, diarrhea, nausea and vomiting.  Genitourinary: Negative for dysuria and hematuria.  Musculoskeletal:  Negative for back pain.  Skin: Negative for rash.  Neurological: Negative for headaches.  All other systems reviewed and are negative.   Physical Exam Updated Vital Signs BP (!) 144/83   Pulse (!) 54   Temp 98.2 F (36.8 C) (Oral)   Resp 12   Ht 6\' 1"  (1.854 m)   Wt 111.1 kg   SpO2 95%   BMI 32.32 kg/m   Physical Exam Vitals and nursing note reviewed.  Constitutional:      Appearance: He is well-developed.  HENT:     Head: Normocephalic and atraumatic.  Eyes:     Conjunctiva/sclera: Conjunctivae normal.  Cardiovascular:     Rate and Rhythm: Normal rate and regular rhythm.     Heart sounds: Normal heart sounds. No  murmur.  Pulmonary:     Effort: Pulmonary effort is normal. No respiratory distress.     Breath sounds: Normal breath sounds. No wheezing, rhonchi or rales.  Abdominal:     General: Bowel sounds are normal.     Palpations: Abdomen is soft.     Tenderness: There is abdominal tenderness in the epigastric area. There is no guarding or rebound.  Musculoskeletal:     Cervical back: Neck supple.  Skin:    General: Skin is warm and dry.  Neurological:     Mental Status: He is alert.     ED Results / Procedures / Treatments   Labs (all labs ordered are listed, but only abnormal results are displayed) Labs Reviewed  COMPREHENSIVE METABOLIC PANEL - Abnormal; Notable for the following components:      Result Value   BUN 24 (*)    Creatinine, Ser 1.43 (*)    GFR calc non Af Amer 59 (*)    All other components within normal limits  LIPASE, BLOOD  CBC  URINALYSIS, ROUTINE W REFLEX MICROSCOPIC  TROPONIN I (HIGH SENSITIVITY)    EKG EKG Interpretation  Date/Time:  Tuesday September 02 2019 21:42:44 EDT Ventricular Rate:  52 PR Interval:    QRS Duration: 106 QT Interval:  442 QTC Calculation: 411 R Axis:   -20 Text Interpretation: Sinus rhythm Prolonged PR interval Borderline left axis deviation No old tracing to compare Confirmed by Aletta Edouard [phone]) on 09/02/2019 9:51:50 PM   Radiology US Abdomen Limited RUQ  Result Date: 09/02/2019 CLINICAL DATA:  44 year old male with right upper quadrant abdominal pain. EXAM: ULTRASOUND ABDOMEN LIMITED RIGHT UPPER QUADRANT COMPARISON:  Right upper quadrant ultrasound dated 08/14/2018. FINDINGS: Gallbladder: There is gallstone. No gallbladder wall thickening or pericholecystic fluid. Negative sonographic Murphy's sign. Common bile duct: Diameter: 5 mm Liver: There is diffuse increased liver echogenicity most commonly seen in the setting of fatty infiltration. Superimposed inflammation or fibrosis is not excluded. Clinical correlation is recommended.  Portal vein is patent on color Doppler imaging with normal direction of blood flow towards the liver. Other: None. IMPRESSION: Cholelithiasis without sonographic evidence of acute cholecystitis. Electronically Signed   By: Anner Crete M.D.   On: 09/02/2019 23:20    Procedures Procedures (including critical care time)  Medications Ordered in ED Medications - No data to display  ED Course  I have reviewed the triage vital signs and the nursing notes.  Pertinent labs & imaging results that were available during my care of the patient were reviewed by me and considered in my medical decision making (see chart for details).  Clinical Course as of Sep 02 2334  Tue Sep 02, 2019  2147 He has a history  of kidney stones but was unable to get an elective surgery due to Covid.  He is complaining of a flareup of his typical pain.  Pain is resolved now.  Getting lab work and likely some imaging.  Disposition per results of testing.   [MB]    Clinical Course User Index [MB] Hayden Rasmussen, MD   MDM Rules/Calculators/A&P                      Pt is a 44 y/o male with a h/o gallstones presenting for eval of ruq abd pain radiating to the right back starting earlier today. Feels similar to prior episodes of biliary colic.   Reviewed/interpreted labs CBC W/o leukocytosis, no anemia CMP with normal bilirubin and LFTs. Normal electrolytes and kidney function Lipase wnl Trop neg UA neg  EKG with Sinus rhythm Prolonged PR interval Borderline left axis deviation No old tracing to compare  RUQ [place] with cholelithiasis without sonographic evidence of acute cholecystitis.  On reassessment pt is in NAD. He declined pain meds while in the ED. He has had no episodes of vomiting and has a nonsurgical abdomen. VS are wnl and pt is nontoxic, nonseptic appearing. sxs are likely secondary to biliary colic. Less likely related to acute intraabdominal cause at this time. Given sxs are under control at this time  feel that patient is appropriate for f/u with general surgery as an outpatient. Have advised patient to f/u as directed and return to the ED for new or worsening sxs in the meantime.   Final Clinical Impression(s) / ED Diagnoses Final diagnoses:  RUQ pain    Rx / DC Orders ED Discharge Orders    None       Bishop Dublin 09/02/19 2336    Hayden Rasmussen, MD 09/03/19 [phone]

## 2019-09-04 ENCOUNTER — Telehealth (INDEPENDENT_AMBULATORY_CARE_PROVIDER_SITE_OTHER): Payer: BC Managed Care – PPO | Admitting: Family Medicine

## 2019-09-04 ENCOUNTER — Encounter: Payer: Self-pay | Admitting: Family Medicine

## 2019-09-04 ENCOUNTER — Telehealth: Payer: Self-pay | Admitting: Family Medicine

## 2019-09-04 VITALS — Ht 73.0 in | Wt 245.0 lb

## 2019-09-04 DIAGNOSIS — K8050 Calculus of bile duct without cholangitis or cholecystitis without obstruction: Secondary | ICD-10-CM

## 2019-09-04 NOTE — Telephone Encounter (Signed)
You can transfer to [facility] if he is calling about his surgery referral.

## 2019-09-04 NOTE — Progress Notes (Signed)
   [name] is a 44 y.o. male who presents today for a virtual office visit.  Assessment/Plan:  Biliary Colic  From cholelithiasis on ultrasound.  Will place urgent referral for him to get into see a surgeon.  Discussed warning signs and reasons to return to care/seek emergent care.  Can use over-the-counter meds if needed.    Subjective:  HPI:  Patient here for ED follow-up.  Was seen in the ED 2 days ago for right upper quadrant pain.  Ultrasound revealed cholelithiasis without cholecystitis.  He was previously diagnosed with cholelithiasis and was scheduled to have elective cholecystectomy however this was deferred due to Covid pandemic.  Symptoms have been reasonably well-controlled to recently flaring up for the past few days.  Symptoms are now much better.  He would like to see a surgeon soon as possible.       Objective/Observations  Physical Exam: Gen: NAD, resting comfortably Pulm: Normal work of breathing Neuro: Grossly normal, moves all extremities Psych: Normal affect and thought content  Virtual Visit via Video   I connected with [name] on 09/04/19 at 11:40 AM EDT by a video enabled telemedicine application and verified that I am speaking with the correct person using two identifiers. The limitations of evaluation and management by telemedicine and the availability of in person appointments were discussed. The patient expressed understanding and agreed to proceed.   Patient location: Home Provider location: [place] participating in the virtual visit: Myself and Patient     [name]. Jerline Pain, MD 09/04/2019 11:35 AM

## 2019-09-18 DIAGNOSIS — K8020 Calculus of gallbladder without cholecystitis without obstruction: Secondary | ICD-10-CM | POA: Insufficient documentation

## 2019-10-01 ENCOUNTER — Other Ambulatory Visit: Payer: Self-pay

## 2019-10-01 ENCOUNTER — Ambulatory Visit (INDEPENDENT_AMBULATORY_CARE_PROVIDER_SITE_OTHER): Payer: BC Managed Care – PPO | Admitting: Physician Assistant

## 2019-10-01 ENCOUNTER — Encounter: Payer: Self-pay | Admitting: Physician Assistant

## 2019-10-01 VITALS — BP 110/78 | HR 51 | Temp 97.3°F | Ht 73.0 in | Wt 252.0 lb

## 2019-10-01 DIAGNOSIS — Z13220 Encounter for screening for lipoid disorders: Secondary | ICD-10-CM

## 2019-10-01 DIAGNOSIS — Z114 Encounter for screening for human immunodeficiency virus [HIV]: Secondary | ICD-10-CM

## 2019-10-01 DIAGNOSIS — E669 Obesity, unspecified: Secondary | ICD-10-CM

## 2019-10-01 DIAGNOSIS — Z0001 Encounter for general adult medical examination with abnormal findings: Secondary | ICD-10-CM | POA: Diagnosis not present

## 2019-10-01 DIAGNOSIS — Z136 Encounter for screening for cardiovascular disorders: Secondary | ICD-10-CM

## 2019-10-01 DIAGNOSIS — R1031 Right lower quadrant pain: Secondary | ICD-10-CM

## 2019-10-01 DIAGNOSIS — K589 Irritable bowel syndrome without diarrhea: Secondary | ICD-10-CM | POA: Diagnosis not present

## 2019-10-01 DIAGNOSIS — M25512 Pain in left shoulder: Secondary | ICD-10-CM

## 2019-10-01 DIAGNOSIS — K8020 Calculus of gallbladder without cholecystitis without obstruction: Secondary | ICD-10-CM | POA: Insufficient documentation

## 2019-10-01 LAB — LIPID PANEL
Cholesterol: 225 mg/dL — ABNORMAL HIGH (ref 0–200)
HDL: 45.3 mg/dL (ref >39.00)
LDL Cholesterol: 155 mg/dL — ABNORMAL HIGH (ref 0–99)
NonHDL: 179.8
Total CHOL/HDL Ratio: 5
Triglycerides: 124 mg/dL (ref 0.0–149.0)
VLDL: 24.8 mg/dL (ref 0.0–40.0)

## 2019-10-01 LAB — CBC WITH DIFFERENTIAL/PLATELET
Basophils Absolute: 0.1 10*3/uL (ref 0.0–0.1)
Basophils Relative: 0.8 % (ref 0.0–3.0)
Eosinophils Absolute: 0.2 10*3/uL (ref 0.0–0.7)
Eosinophils Relative: 2.7 % (ref 0.0–5.0)
HCT: 41.7 % (ref 39.0–52.0)
Hemoglobin: 14.3 g/dL (ref 13.0–17.0)
Lymphocytes Relative: 36.6 % (ref 12.0–46.0)
Lymphs Abs: 2.5 10*3/uL (ref 0.7–4.0)
MCHC: 34.3 g/dL (ref 30.0–36.0)
MCV: 90 fl (ref 78.0–100.0)
Monocytes Absolute: 0.4 10*3/uL (ref 0.1–1.0)
Monocytes Relative: 6.3 % (ref 3.0–12.0)
Neutro Abs: 3.7 10*3/uL (ref 1.4–7.7)
Neutrophils Relative %: 53.6 % (ref 43.0–77.0)
Platelets: 205 10*3/uL (ref 150.0–400.0)
RBC: 4.64 Mil/uL (ref 4.22–5.81)
RDW: 12.8 % (ref 11.5–15.5)
WBC: 6.9 10*3/uL (ref 4.0–10.5)

## 2019-10-01 LAB — COMPREHENSIVE METABOLIC PANEL
ALT: 35 U/L (ref 0–53)
AST: 19 U/L (ref 0–37)
Albumin: 4.7 g/dL (ref 3.5–5.2)
Alkaline Phosphatase: 47 U/L (ref 39–117)
BUN: 21 mg/dL (ref 6–23)
CO2: 29 mEq/L (ref 19–32)
Calcium: 9.7 mg/dL (ref 8.4–10.5)
Chloride: 103 mEq/L (ref 96–112)
Creatinine, Ser: 1.04 mg/dL (ref 0.40–1.50)
GFR: 77.11 mL/min (ref >60.00)
Glucose, Bld: 90 mg/dL (ref 70–99)
Potassium: 4.7 mEq/L (ref 3.5–5.1)
Sodium: 138 mEq/L (ref 135–145)
Total Bilirubin: 0.6 mg/dL (ref 0.2–1.2)
Total Protein: 7.3 g/dL (ref 6.0–8.3)

## 2019-10-01 NOTE — Progress Notes (Signed)
Subjective:    [name] is a 44 y.o. male and is here for a comprehensive physical exam.  HPI  Health Maintenance Due  Topic Date Due  . HIV Screening  Never done  . COVID-19 Vaccine (1) Never done    Acute Concerns: L shoulder pain -- Sunday landed on shoulder while playing soccer and playing as a goalie. Symptoms are getting better with time, however he still has pain with overhead movements. Didn't take any medication. Able to sleep without concerns. Hx of rotator cuff repair. R inguinal pain --has noticed that when he goes from sitting to standing and other movements he has a little bit of pressure/pain in his right groin.  Denies any bulge or inciting event.  Denies any testicular pain.  Chronic Issues: IBS - overall controlled with diet; denies acute concerns Gallstones -- scheduled for cholecystectomy in June; denies any acute concerns  Health Maintenance: Immunizations --needs Covid vaccine Colonoscopy --will start at age 27 PSA -- deferred Diet -- eats all food groups Caffeine intake -- 3-4 cups daily Sleep habits -- minimal due to recent puppy  Exercise -- plays soccer weekly Weight -- Weight: 252 lb (114.3 kg)  Weight history Wt Readings from Last 10 Encounters:  10/01/19 252 lb (114.3 kg)  09/04/19 245 lb (111.1 kg)  09/02/19 245 lb (111.1 kg)  07/29/18 262 lb (118.8 kg)  11/12/14 260 lb (117.9 kg)  01/07/13 251 lb (113.9 kg)   Mood -- no significant concerns Tobacco use -- none currently Alcohol use --- 1 drink per week or less  Depression screen [facility] 2/9 10/01/2019  Decreased Interest 0  Down, Depressed, Hopeless 0  PHQ - 2 Score 0  Altered sleeping -  Tired, decreased energy -  Change in appetite -  Feeling bad or failure about yourself  -  Trouble concentrating -  Moving slowly or fidgety/restless -  Suicidal thoughts -  PHQ-9 Score -  Difficult doing work/chores -     Other providers/specialists: Patient Care Team: Inda Coke, [place] as PCP - General (Physician Assistant)   PMHx, SurgHx, SocialHx, Medications, and Allergies were reviewed in the Visit Navigator and updated as appropriate.   Past Medical History:  Diagnosis Date  . Allergic rhinitis   . Chicken pox   . Family history of pancreatic cancer 07/29/2018  . Family history of pancreatic cancer   . Hyperlipidemia   . Irritable bowel syndrome 07/29/2018   diarrhea     Past Surgical History:  Procedure Laterality Date  . ROTATOR CUFF REPAIR Bilateral      Family History  Problem Relation Age of Onset  . Alcohol abuse Mother   . Pancreatic cancer Mother 104  . Early death Mother   . Heart disease Father   . Cancer Father        Lung  . Alcohol abuse Father   . Hypertension Father   . Hyperlipidemia Father   . Alcohol abuse Sister   . Stroke Maternal Grandfather   . Non-Hodgkin's lymphoma Paternal Uncle 15  . Colon cancer Neg Hx     Social History   Tobacco Use  . Smoking status: Former Smoker    Packs/day: 0.50    Start date: 05/29/1994    Quit date: 05/29/2004    Years since quitting: 15.3  . Smokeless tobacco: Never Used  Substance Use Topics  . Alcohol use: Yes    Alcohol/week: 1.0 standard drinks    Types: 1 Standard drinks or equivalent per  week  . Drug use: No    Review of Systems:   Review of Systems  Constitutional: Negative for chills, fever, malaise/fatigue and weight loss.  HENT: Negative for hearing loss, sinus pain and sore throat.   Respiratory: Negative for cough and hemoptysis.   Cardiovascular: Negative for chest pain, palpitations, leg swelling and PND.  Gastrointestinal: Negative for abdominal pain, constipation, diarrhea, heartburn, nausea and vomiting.  Genitourinary: Negative for dysuria, frequency and urgency.  Musculoskeletal: Positive for joint pain. Negative for back pain, myalgias and neck pain.  Skin: Negative for itching and rash.  Neurological: Negative for dizziness, tingling, seizures and  headaches.  Endo/Heme/Allergies: Negative for polydipsia.  Psychiatric/Behavioral: Negative for depression. The patient is not nervous/anxious.        Objective:   Vitals:   10/01/19 0832  BP: 110/78  Pulse: (!) 51  Temp: (!) 97.3 F (36.3 C)  SpO2: 98%   Body mass index is 33.25 kg/m.  General Appearance:  Alert, cooperative, no distress, appears stated age  Head:  Normocephalic, without obvious abnormality, atraumatic  Eyes:  PERRL, conjunctiva/corneas clear, EOM's intact, fundi benign, both eyes       Ears:  Normal TM's and external ear canals, both ears  Nose: Nares normal, septum midline, mucosa normal, no drainage    or sinus tenderness  Throat: Lips, mucosa, and tongue normal; teeth and gums normal  Neck: Supple, symmetrical, trachea midline, no adenopathy; thyroid:  No enlargement/tenderness/nodules; no carotit bruit or JVD  Back:   Symmetric, no curvature, ROM normal, no CVA tenderness  Lungs:   Clear to auscultation bilaterally, respirations unlabored  Chest wall:  No tenderness or deformity  Heart:  Regular rate and rhythm, S1 and S2 normal, no murmur, rub   or gallop  Abdomen:   Soft, non-tender, bowel sounds active all four quadrants, no masses, no organomegaly  Extremities: Extremities normal, atraumatic, no cyanosis or edema L shoulder: Limited free range of motion with overhead movements due to pain; no obvious visual abnormalities  Prostate/hernia Not done.  Negative hernia exam  Skin: Skin color, texture, turgor normal, no rashes or lesions  Lymph nodes: Cervical, supraclavicular, and axillary nodes normal  Neurologic: CNII-XII grossly intact. Normal strength, sensation and reflexes throughout    Assessment/Plan:   [name] was seen today for transitions of care, annual exam and shoulder pain.  Diagnoses and all orders for this visit:  Encounter for general adult medical examination with abnormal findings Today patient counseled on age appropriate  routine health concerns for screening and prevention, each reviewed and up to date or declined. Immunizations reviewed and up to date or declined. Labs ordered and reviewed. Risk factors for depression reviewed and negative. Hearing function and visual acuity are intact. ADLs screened and addressed as needed. Functional ability and level of safety reviewed and appropriate. Education, counseling and referrals performed based on assessed risks today. Patient provided with a copy of personalized plan for preventive services.  Acute pain of left shoulder Discussed watchful waiting approach versus referral to sports medicine or physical therapy.  Patient states he probably does not have the time to commit to physical therapy at this time but would like to see sports medicine for further evaluation and management of his pain.  I have put in referral. -     Ambulatory referral to Sports Medicine  Right inguinal pain No hernia on my exam today, but will also have our sports medicine provider evaluate his groin to see if there is any further  work-up that will be needed. -     Ambulatory referral to Sports Medicine  Screening for HIV (human immunodeficiency virus) -     HIV Antibody (routine testing w rflx)  Encounter for lipid screening for cardiovascular disease -     Lipid panel  Irritable bowel syndrome, unspecified type Denies any acute issues, continue to work on bland diet. -     CBC w/Diff -     Comprehensive metabolic panel  Gallstones Denies any acute issues, scheduled surgery in June.  Obesity Encouraged ongoing healthy diet and exercise as able.  Well Adult Exam: Labs ordered: Yes. Patient counseling was done. See below for items discussed. Discussed the patient's BMI.  The BMI is not in the acceptable range; BMI management plan is completed Follow up in one year.  Patient Counseling: [x]   Nutrition: Stressed importance of moderation in sodium/caffeine intake, saturated fat and  cholesterol, caloric balance, sufficient intake of fresh fruits, vegetables, and fiber.  [x]   Stressed the importance of regular exercise.   []   Substance Abuse: Discussed cessation/primary prevention of tobacco, alcohol, or other drug use; driving or other dangerous activities under the influence; availability of treatment for abuse.   [x]   Injury prevention: Discussed safety belts, safety helmets, smoke detector, smoking near bedding or upholstery.   []   Sexuality: Discussed sexually transmitted diseases, partner selection, use of condoms, avoidance of unintended pregnancy  and contraceptive alternatives.   [x]   Dental health: Discussed importance of regular tooth brushing, flossing, and dental visits.  [x]   Health maintenance and immunizations reviewed. Please refer to Health maintenance section.    CMA or LPN served as scribe during this visit. History, Physical, and Plan performed by medical provider. The above documentation has been reviewed and is accurate and complete.   Inda Coke, PA-C [place]

## 2019-10-01 NOTE — Patient Instructions (Signed)
It was great to see you!  A referral has been placed for you to see Dr. Lynne Leader with [facility]. Someone from there office will be in touch soon regarding your appointment with him. His location: [place] at [facility] [address] on the 1st floor.   Phone number [phone], Fax [phone].  This location is across the street from the entrance to [employer] and in the same complex as the [facility] and [facility]  Please go to the lab for blood work.   Our office will call you with your results unless you have chosen to receive results via MyChart.  If your blood work is normal we will follow-up each year for physicals and as scheduled for chronic medical problems.  If anything is abnormal we will treat accordingly and get you in for a follow-up.  Take care,  [facility] Maintenance, Male Adopting a healthy lifestyle and getting preventive care are important in promoting health and wellness. Ask your health care provider about:  The right schedule for you to have regular tests and exams.  Things you can do on your own to prevent diseases and keep yourself healthy. What should I know about diet, weight, and exercise? Eat a healthy diet   Eat a diet that includes plenty of vegetables, fruits, low-fat dairy products, and lean protein.  Do not eat a lot of foods that are high in solid fats, added sugars, or sodium. Maintain a healthy weight Body mass index (BMI) is a measurement that can be used to identify possible weight problems. It estimates body fat based on height and weight. Your health care provider can help determine your BMI and help you achieve or maintain a healthy weight. Get regular exercise Get regular exercise. This is one of the most important things you can do for your health. Most adults should:  Exercise for at least 150 minutes each week. The exercise should increase your heart rate  and make you sweat (moderate-intensity exercise).  Do strengthening exercises at least twice a week. This is in addition to the moderate-intensity exercise.  Spend less time sitting. Even light physical activity can be beneficial. Watch cholesterol and blood lipids Have your blood tested for lipids and cholesterol at 45 years of age, then have this test every 5 years. You may need to have your cholesterol levels checked more often if:  Your lipid or cholesterol levels are high.  You are older than 45 years of age.  You are at high risk for heart disease. What should I know about cancer screening? Many types of cancers can be detected early and may often be prevented. Depending on your health history and family history, you may need to have cancer screening at various ages. This may include screening for:  Colorectal cancer.  Prostate cancer.  Skin cancer.  Lung cancer. What should I know about heart disease, diabetes, and high blood pressure? Blood pressure and heart disease  High blood pressure causes heart disease and increases the risk of stroke. This is more likely to develop in people who have high blood pressure readings, are of African descent, or are overweight.  Talk with your health care provider about your target blood pressure readings.  Have your blood pressure checked: ? Every 3-5 years if you are 81-56 years of age. ? Every year if you are 48 years old or older.  If you are between the ages of 7 and  24 and are a current or former smoker, ask your health care provider if you should have a one-time screening for abdominal aortic aneurysm (AAA). Diabetes Have regular diabetes screenings. This checks your fasting blood sugar level. Have the screening done:  Once every three years after age 74 if you are at a normal weight and have a low risk for diabetes.  More often and at a younger age if you are overweight or have a high risk for diabetes. What should I know  about preventing infection? Hepatitis B If you have a higher risk for hepatitis B, you should be screened for this virus. Talk with your health care provider to find out if you are at risk for hepatitis B infection. Hepatitis C Blood testing is recommended for:  Everyone born from [birth year] through 1966.  Anyone with known risk factors for hepatitis C. Sexually transmitted infections (STIs)  You should be screened each year for STIs, including gonorrhea and chlamydia, if: ? You are sexually active and are younger than 45 years of age. ? You are older than 45 years of age and your health care provider tells you that you are at risk for this type of infection. ? Your sexual activity has changed since you were last screened, and you are at increased risk for chlamydia or gonorrhea. Ask your health care provider if you are at risk.  Ask your health care provider about whether you are at high risk for HIV. Your health care provider may recommend a prescription medicine to help prevent HIV infection. If you choose to take medicine to prevent HIV, you should first get tested for HIV. You should then be tested every 3 months for as long as you are taking the medicine. Follow these instructions at home: Lifestyle  Do not use any products that contain nicotine or tobacco, such as cigarettes, e-cigarettes, and chewing tobacco. If you need help quitting, ask your health care provider.  Do not use street drugs.  Do not share needles.  Ask your health care provider for help if you need support or information about quitting drugs. Alcohol use  Do not drink alcohol if your health care provider tells you not to drink.  If you drink alcohol: ? Limit how much you have to 0-2 drinks a day. ? Be aware of how much alcohol is in your drink. In the [place] one drink equals one 12 oz bottle of beer (355 mL), one 5 oz glass of wine (148 mL), or one 1 oz glass of hard liquor (44 mL). General  instructions  Schedule regular health, dental, and eye exams.  Stay current with your vaccines.  Tell your health care provider if: ? You often feel depressed. ? You have ever been abused or do not feel safe at home. Summary  Adopting a healthy lifestyle and getting preventive care are important in promoting health and wellness.  Follow your health care provider's instructions about healthy diet, exercising, and getting tested or screened for diseases.  Follow your health care provider's instructions on monitoring your cholesterol and blood pressure. This information is not intended to replace advice given to you by your health care provider. Make sure you discuss any questions you have with your health care provider. Document Revised: 05/08/2018 Document Reviewed: 05/08/2018 Elsevier Patient Education  2020 [employer].

## 2019-10-02 LAB — HIV ANTIBODY (ROUTINE TESTING W REFLEX): HIV 1&2 Ab, 4th Generation: NONREACTIVE

## 2019-10-07 ENCOUNTER — Other Ambulatory Visit: Payer: Self-pay

## 2019-10-07 ENCOUNTER — Ambulatory Visit (INDEPENDENT_AMBULATORY_CARE_PROVIDER_SITE_OTHER): Payer: BC Managed Care – PPO | Admitting: Family Medicine

## 2019-10-07 ENCOUNTER — Ambulatory Visit: Payer: Self-pay

## 2019-10-07 ENCOUNTER — Encounter: Payer: Self-pay | Admitting: Family Medicine

## 2019-10-07 ENCOUNTER — Ambulatory Visit (INDEPENDENT_AMBULATORY_CARE_PROVIDER_SITE_OTHER): Payer: BC Managed Care – PPO

## 2019-10-07 VITALS — BP 104/70 | HR 58 | Ht 73.0 in | Wt 253.6 lb

## 2019-10-07 DIAGNOSIS — S4992XA Unspecified injury of left shoulder and upper arm, initial encounter: Secondary | ICD-10-CM | POA: Diagnosis not present

## 2019-10-07 DIAGNOSIS — M25512 Pain in left shoulder: Secondary | ICD-10-CM

## 2019-10-07 MED ORDER — NITROGLYCERIN 0.2 MG/HR TD PT24: MEDICATED_PATCH | TRANSDERMAL | 1 refills | Status: DC

## 2019-10-07 NOTE — Progress Notes (Signed)
X-ray left shoulder looks pretty normal to radiology.

## 2019-10-07 NOTE — Progress Notes (Signed)
Subjective:    I'm seeing this patient as a consultation for:  [name], [place]. Note will be routed back to referring provider/PCP.  CC: L shoulder pain  I, Molly Weber, LAT, ATC, am serving as scribe for Dr. Lynne Leader.  HPI: Pt is a 44 y/o male presenting w/ c/o L shoulder pain after landing w/ his L arm outstretched while playing goalie during a soccer game.  He has a hx of a L shoulder RC repair in 2006.  He locates his pain to his posterior-lateral shoulder.  He rates his pain as mild at rest and mod-severe w/ movement and describes his pain as aching and throbbing in general and sharp w/ aggravating activity.  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  He has a game this weekend that he would like to plan.  Radiating pain: No L shoulder mechanical symptoms: yes Aggravating factors: L shoulder overhead AROM; functional IR Treatments tried: ice, Advil, Turmeric  Past medical history, Surgical history, Family history, Social history, Allergies, and medications have been entered into the medical record, reviewed.  Hx 2006 left RTC Repair Dr Noemi Chapel.  History of right rotator cuff repair as well [place]. Upcoming laparoscopic cholecystectomy planned June 14  Review of Systems: No new headache, visual changes, nausea, vomiting, diarrhea, constipation, dizziness, abdominal pain, skin rash, fevers, chills, night sweats, weight loss, swollen lymph nodes, body aches, joint swelling, muscle aches, chest pain, shortness of breath, mood changes, visual or auditory hallucinations.   Objective:    Vitals:   10/07/19 0826  BP: 104/70  Pulse: (!) 58  SpO2: 96%   General: Well Developed, well nourished, and in no acute distress.  Neuro/Psych: Alert and oriented x3, extra-ocular muscles intact, able to move all 4 extremities, sensation grossly intact. Skin: Warm and dry, no rashes noted.  Respiratory: Not  using accessory muscles, speaking in full sentences, trachea midline.  Cardiovascular: Pulses palpable, no extremity edema. Abdomen: Does not appear distended. MSK:  C-spine normal-appearing normal cervical motion nontender. Left shoulder normal-appearing Nontender to palpation. Range of motion abduction full however painful arc up to 120 degrees. Internal rotation limited to lumbar spine. External rotation full. Strength intact abduction and internal rotation.  External rotation strength slightly reduced 4+/5 with mild pain. Negative empty can test. Mildly positive Hawkins and Neer's test. Negative Yergason's and speeds test.  Contralateral right shoulder normal-appearing normal motion Nontender. Normal strength. Negative impingement or biceps tendinitis testing.  Lab and Radiology Results  X-ray images left shoulder obtained today personally and independently reviewed No acute fractures normal-appearing x-ray with some evidence of subacromial decompression. Await formal radiology review  Diagnostic Limited MSK Ultrasound of: Left shoulder Steps tendon normal-appearing intact in bicipital groove. Subscapularis tendon normal. Supraspinatus tendon normal-appearing without obvious tear.  Increased size subacromial bursa present. Infraspinatus tendon normal-appearing without obvious tear. AC joint wide without large effusion. Posterior joint visualized no obvious abnormalities however resolution impaired due to need to reduce frequency to penetrate deep enough to see the posterior joint. Bony structures otherwise normal Impression: Subacromial bursitis.  No large full-thickness rotator cuff tear visible   Impression and Recommendations:    Assessment and Plan: 44 y.o. male with left shoulder injury occurring after falling in his shoulder playing soccer goalie about 2 weeks ago.  Fortunately patient has reasonably well-preserved strength.  Ultrasound not show large full-thickness  tear. Patient likely does have a rotator cuff strain  and possibly does have a small incomplete rotator cuff tear that I am not able to see with ultrasound today. Discussed options.  Plan for home exercise program as taught in clinic today by ATC.  Additionally use nitroglycerin patch protocol.  If is not improving on his own he will notify me in a few weeks and we will proceed with physical therapy.  We will use [facility] as that is where he lives.  Discussed injection but the patient and I think that injection at this time is premature.  Recheck 6 weeks or so.  Additionally spent time discussing safety of playing soccer this weekend.  Fundamentally this is a value judgment.  He probably mechanically could play soccer but he will be certainly impaired and not a very good soccer goalie with his injury.  Personally I think the risk of reinjury or new injury is high enough that play should be avoided however if he really wants to play he can.  [id number]; 15 additional minutes spent for Therapeutic exercises as stated in above notes.  This included exercises focusing on stretching, strengthening, with significant focus on eccentric aspects.   Long term goals include an improvement in range of motion, strength, endurance as well as avoiding reinjury. Patient's frequency would include in 1-2 times a day, 3-5 times a week for a duration of 6-12 weeks.  Proper technique shown and discussed handout in great detail with ATC.  All questions were discussed and answered.  .   Orders Placed This Encounter  Procedures  . [place] LIMITED JOINT SPACE STRUCTURES UP LEFT(NO LINKED CHARGES)    Order Specific Question:   Reason for Exam (SYMPTOM  OR DIAGNOSIS REQUIRED)    Answer:   L shoulder pain    Order Specific Question:   Preferred imaging location?    Answer:   [place]  . DG Shoulder Left    Standing Status:   Future    Number of Occurrences:   1    Standing Expiration Date:   12/06/2020      Order Specific Question:   Reason for Exam (SYMPTOM  OR DIAGNOSIS REQUIRED)    Answer:   eval left shoulder pain    Order Specific Question:   Preferred imaging location?    Answer:   [facility]    Order Specific Question:   Radiology Contrast Protocol - do NOT remove file path    Answer:   [email or web address]   Meds ordered this encounter  Medications  . nitroGLYCERIN (NITRODUR - DOSED IN MG/24 HR) 0.2 mg/hr patch    Sig: Apply 1/4 patch daily to tendon for tendonitis.    Dispense:  30 patch    Refill:  1    Discussed warning signs or symptoms. Please see discharge instructions. Patient expresses understanding.   The above documentation has been reviewed and is accurate and complete Lynne Leader

## 2019-10-07 NOTE — Patient Instructions (Addendum)
Thank you for coming in today. Get xray on your way out.  Do the home exercise PT.   Please perform the exercise program that we have prepared for you and gone over in detail on a daily basis.  In addition to the handout you were provided you can access your program through: [email or web address]   Your unique program code is: [email or web address]   Let me know if not getting better in about 2 weeks. I will send a referral to [facility].  Use nitro patches.   Nitroglycerin Protocol   Apply 1/4 nitroglycerin patch to affected area daily.  Change position of patch within the affected area every 24 hours.  You may experience a headache during the first 1-2 weeks of using the patch, these should subside.  If you experience headaches after beginning nitroglycerin patch treatment, you may take your preferred over the counter pain reliever.  Another side effect of the nitroglycerin patch is skin irritation or rash related to patch adhesive.  Please notify our office if you develop more severe headaches or rash, and stop the patch.  Tendon healing with nitroglycerin patch may require 12 to 24 weeks depending on the extent of injury.  Men should not use if taking Viagra, Cialis, or Levitra.   Do not use if you have migraines or rosacea.

## 2019-11-03 DIAGNOSIS — Z03818 Encounter for observation for suspected exposure to other biological agents ruled out: Secondary | ICD-10-CM | POA: Diagnosis not present

## 2019-11-03 DIAGNOSIS — Z20822 Contact with and (suspected) exposure to covid-19: Secondary | ICD-10-CM | POA: Diagnosis not present

## 2019-11-10 DIAGNOSIS — K8070 Calculus of gallbladder and bile duct without cholecystitis without obstruction: Secondary | ICD-10-CM | POA: Diagnosis not present

## 2019-11-10 DIAGNOSIS — K8010 Calculus of gallbladder with chronic cholecystitis without obstruction: Secondary | ICD-10-CM | POA: Diagnosis not present

## 2019-11-10 DIAGNOSIS — K8020 Calculus of gallbladder without cholecystitis without obstruction: Secondary | ICD-10-CM | POA: Diagnosis not present

## 2020-02-13 DIAGNOSIS — S92002A Unspecified fracture of left calcaneus, initial encounter for closed fracture: Secondary | ICD-10-CM | POA: Diagnosis not present

## 2020-02-20 DIAGNOSIS — S92002D Unspecified fracture of left calcaneus, subsequent encounter for fracture with routine healing: Secondary | ICD-10-CM | POA: Diagnosis not present

## 2020-02-24 DIAGNOSIS — L814 Other melanin hyperpigmentation: Secondary | ICD-10-CM | POA: Diagnosis not present

## 2020-02-24 DIAGNOSIS — L578 Other skin changes due to chronic exposure to nonionizing radiation: Secondary | ICD-10-CM | POA: Diagnosis not present

## 2020-02-24 DIAGNOSIS — L821 Other seborrheic keratosis: Secondary | ICD-10-CM | POA: Diagnosis not present

## 2020-02-24 DIAGNOSIS — D225 Melanocytic nevi of trunk: Secondary | ICD-10-CM | POA: Diagnosis not present

## 2020-03-05 DIAGNOSIS — S92002D Unspecified fracture of left calcaneus, subsequent encounter for fracture with routine healing: Secondary | ICD-10-CM | POA: Diagnosis not present

## 2020-04-02 DIAGNOSIS — M71572 Other bursitis, not elsewhere classified, left ankle and foot: Secondary | ICD-10-CM | POA: Diagnosis not present

## 2020-04-02 DIAGNOSIS — S92002D Unspecified fracture of left calcaneus, subsequent encounter for fracture with routine healing: Secondary | ICD-10-CM | POA: Diagnosis not present

## 2020-09-09 DIAGNOSIS — M79672 Pain in left foot: Secondary | ICD-10-CM | POA: Diagnosis not present

## 2020-09-09 DIAGNOSIS — M25572 Pain in left ankle and joints of left foot: Secondary | ICD-10-CM | POA: Diagnosis not present

## 2020-10-02 DIAGNOSIS — M79672 Pain in left foot: Secondary | ICD-10-CM | POA: Diagnosis not present

## 2020-10-11 DIAGNOSIS — M722 Plantar fascial fibromatosis: Secondary | ICD-10-CM | POA: Diagnosis not present

## 2020-10-27 DIAGNOSIS — M25672 Stiffness of left ankle, not elsewhere classified: Secondary | ICD-10-CM | POA: Diagnosis not present

## 2020-10-27 DIAGNOSIS — M79672 Pain in left foot: Secondary | ICD-10-CM | POA: Diagnosis not present

## 2020-10-27 DIAGNOSIS — R531 Weakness: Secondary | ICD-10-CM | POA: Diagnosis not present

## 2020-10-27 DIAGNOSIS — R262 Difficulty in walking, not elsewhere classified: Secondary | ICD-10-CM | POA: Diagnosis not present

## 2020-11-01 DIAGNOSIS — M25672 Stiffness of left ankle, not elsewhere classified: Secondary | ICD-10-CM | POA: Diagnosis not present

## 2020-11-01 DIAGNOSIS — R262 Difficulty in walking, not elsewhere classified: Secondary | ICD-10-CM | POA: Diagnosis not present

## 2020-11-01 DIAGNOSIS — R531 Weakness: Secondary | ICD-10-CM | POA: Diagnosis not present

## 2020-11-01 DIAGNOSIS — M79672 Pain in left foot: Secondary | ICD-10-CM | POA: Diagnosis not present

## 2020-11-03 DIAGNOSIS — R531 Weakness: Secondary | ICD-10-CM | POA: Diagnosis not present

## 2020-11-03 DIAGNOSIS — R262 Difficulty in walking, not elsewhere classified: Secondary | ICD-10-CM | POA: Diagnosis not present

## 2020-11-03 DIAGNOSIS — M79672 Pain in left foot: Secondary | ICD-10-CM | POA: Diagnosis not present

## 2020-11-03 DIAGNOSIS — M25672 Stiffness of left ankle, not elsewhere classified: Secondary | ICD-10-CM | POA: Diagnosis not present

## 2020-11-08 DIAGNOSIS — M25672 Stiffness of left ankle, not elsewhere classified: Secondary | ICD-10-CM | POA: Diagnosis not present

## 2020-11-08 DIAGNOSIS — M79672 Pain in left foot: Secondary | ICD-10-CM | POA: Diagnosis not present

## 2020-11-08 DIAGNOSIS — R531 Weakness: Secondary | ICD-10-CM | POA: Diagnosis not present

## 2020-11-08 DIAGNOSIS — R262 Difficulty in walking, not elsewhere classified: Secondary | ICD-10-CM | POA: Diagnosis not present

## 2020-11-12 DIAGNOSIS — R262 Difficulty in walking, not elsewhere classified: Secondary | ICD-10-CM | POA: Diagnosis not present

## 2020-11-12 DIAGNOSIS — M79672 Pain in left foot: Secondary | ICD-10-CM | POA: Diagnosis not present

## 2020-11-12 DIAGNOSIS — M25672 Stiffness of left ankle, not elsewhere classified: Secondary | ICD-10-CM | POA: Diagnosis not present

## 2020-11-12 DIAGNOSIS — R531 Weakness: Secondary | ICD-10-CM | POA: Diagnosis not present

## 2020-11-15 DIAGNOSIS — R262 Difficulty in walking, not elsewhere classified: Secondary | ICD-10-CM | POA: Diagnosis not present

## 2020-11-15 DIAGNOSIS — M25672 Stiffness of left ankle, not elsewhere classified: Secondary | ICD-10-CM | POA: Diagnosis not present

## 2020-11-15 DIAGNOSIS — R531 Weakness: Secondary | ICD-10-CM | POA: Diagnosis not present

## 2020-11-15 DIAGNOSIS — M79672 Pain in left foot: Secondary | ICD-10-CM | POA: Diagnosis not present

## 2020-11-22 DIAGNOSIS — M79672 Pain in left foot: Secondary | ICD-10-CM | POA: Diagnosis not present

## 2020-11-22 DIAGNOSIS — R262 Difficulty in walking, not elsewhere classified: Secondary | ICD-10-CM | POA: Diagnosis not present

## 2020-11-22 DIAGNOSIS — M25672 Stiffness of left ankle, not elsewhere classified: Secondary | ICD-10-CM | POA: Diagnosis not present

## 2020-11-22 DIAGNOSIS — R531 Weakness: Secondary | ICD-10-CM | POA: Diagnosis not present

## 2020-11-26 DIAGNOSIS — M79672 Pain in left foot: Secondary | ICD-10-CM | POA: Diagnosis not present

## 2020-11-26 DIAGNOSIS — R262 Difficulty in walking, not elsewhere classified: Secondary | ICD-10-CM | POA: Diagnosis not present

## 2020-11-26 DIAGNOSIS — M25672 Stiffness of left ankle, not elsewhere classified: Secondary | ICD-10-CM | POA: Diagnosis not present

## 2020-11-26 DIAGNOSIS — R531 Weakness: Secondary | ICD-10-CM | POA: Diagnosis not present

## 2021-06-27 ENCOUNTER — Ambulatory Visit (INDEPENDENT_AMBULATORY_CARE_PROVIDER_SITE_OTHER): Payer: BC Managed Care – PPO | Admitting: Physician Assistant

## 2021-06-27 ENCOUNTER — Encounter: Payer: Self-pay | Admitting: Physician Assistant

## 2021-06-27 ENCOUNTER — Other Ambulatory Visit: Payer: Self-pay

## 2021-06-27 VITALS — BP 100/70 | HR 44 | Temp 97.7°F | Ht 72.5 in | Wt 256.0 lb

## 2021-06-27 DIAGNOSIS — Z1159 Encounter for screening for other viral diseases: Secondary | ICD-10-CM | POA: Diagnosis not present

## 2021-06-27 DIAGNOSIS — F418 Other specified anxiety disorders: Secondary | ICD-10-CM

## 2021-06-27 DIAGNOSIS — E669 Obesity, unspecified: Secondary | ICD-10-CM

## 2021-06-27 DIAGNOSIS — K580 Irritable bowel syndrome with diarrhea: Secondary | ICD-10-CM

## 2021-06-27 DIAGNOSIS — Z1211 Encounter for screening for malignant neoplasm of colon: Secondary | ICD-10-CM

## 2021-06-27 DIAGNOSIS — Z0001 Encounter for general adult medical examination with abnormal findings: Secondary | ICD-10-CM

## 2021-06-27 LAB — CBC WITH DIFFERENTIAL/PLATELET
Basophils Absolute: 0 10*3/uL (ref 0.0–0.1)
Basophils Relative: 0.5 % (ref 0.0–3.0)
Eosinophils Absolute: 0.1 10*3/uL (ref 0.0–0.7)
Eosinophils Relative: 1.5 % (ref 0.0–5.0)
HCT: 42.5 % (ref 39.0–52.0)
Hemoglobin: 14.2 g/dL (ref 13.0–17.0)
Lymphocytes Relative: 30.5 % (ref 12.0–46.0)
Lymphs Abs: 2.1 10*3/uL (ref 0.7–4.0)
MCHC: 33.4 g/dL (ref 30.0–36.0)
MCV: 90.1 fl (ref 78.0–100.0)
Monocytes Absolute: 0.3 10*3/uL (ref 0.1–1.0)
Monocytes Relative: 4.8 % (ref 3.0–12.0)
Neutro Abs: 4.4 10*3/uL (ref 1.4–7.7)
Neutrophils Relative %: 62.7 % (ref 43.0–77.0)
Platelets: 205 10*3/uL (ref 150.0–400.0)
RBC: 4.72 Mil/uL (ref 4.22–5.81)
RDW: 13.1 % (ref 11.5–15.5)
WBC: 7 10*3/uL (ref 4.0–10.5)

## 2021-06-27 LAB — LIPID PANEL
Cholesterol: 176 mg/dL (ref 0–200)
HDL: 43.8 mg/dL (ref >39.00)
LDL Cholesterol: 118 mg/dL — ABNORMAL HIGH (ref 0–99)
NonHDL: 132.22
Total CHOL/HDL Ratio: 4
Triglycerides: 69 mg/dL (ref 0.0–149.0)
VLDL: 13.8 mg/dL (ref 0.0–40.0)

## 2021-06-27 LAB — COMPREHENSIVE METABOLIC PANEL
ALT: 32 U/L (ref 0–53)
AST: 22 U/L (ref 0–37)
Albumin: 4.8 g/dL (ref 3.5–5.2)
Alkaline Phosphatase: 43 U/L (ref 39–117)
BUN: 20 mg/dL (ref 6–23)
CO2: 26 mEq/L (ref 19–32)
Calcium: 10 mg/dL (ref 8.4–10.5)
Chloride: 103 mEq/L (ref 96–112)
Creatinine, Ser: 1.19 mg/dL (ref 0.40–1.50)
GFR: 72.96 mL/min (ref >60.00)
Glucose, Bld: 84 mg/dL (ref 70–99)
Potassium: 4.6 mEq/L (ref 3.5–5.1)
Sodium: 137 mEq/L (ref 135–145)
Total Bilirubin: 0.6 mg/dL (ref 0.2–1.2)
Total Protein: 7.4 g/dL (ref 6.0–8.3)

## 2021-06-27 MED ORDER — BUSPIRONE HCL 10 MG PO TABS: 10.0000 mg | ORAL_TABLET | Freq: Three times a day (TID) | ORAL | 2 refills | Status: DC | PRN

## 2021-06-27 MED ORDER — DICYCLOMINE HCL 10 MG PO CAPS: 10.0000 mg | ORAL_CAPSULE | Freq: Four times a day (QID) | ORAL | 1 refills | Status: DC | PRN

## 2021-06-27 NOTE — Patient Instructions (Addendum)
It was great to see you!  For your situational anxiety: you may take Buspar 10 mg up to 3 times daily as needed. If you do not feel like this is effective, let me know.  For you IBS and cramping: you may take Bentyl 10 mg up to 4 times daily as needed.  Gastroenterology referral placed  Please go to the lab for blood work.   Our office will call you with your results unless you have chosen to receive results via MyChart.  If your blood work is normal we will follow-up each year for physicals and as scheduled for chronic medical problems.  If anything is abnormal we will treat accordingly and get you in for a follow-up.  Take care,  Judene Logue  .

## 2021-06-27 NOTE — Progress Notes (Signed)
Subjective:    [name] is a 46 y.o. male and is here for a comprehensive physical exam.  HPI  Health Maintenance Due  Topic Date Due   Hepatitis C Screening  Never done   COLONOSCOPY (Pts 45-43yrs Insurance coverage will need to be confirmed)  Never done    Acute Concerns: Anxiety Following his return from [place], [name] noticed that he experienced increased feelings of anxiety when in large crowds. Although this was going on he decided that he could work through this issue on his own. Despite his mood seemingly improving, it wasn't until the end of complete COVID-19 shutdown, that he noticed this problem was not fully resolved. According to pt, he mainly experiences feelings of nervousness when people become too close to him and he seemingly can't escape.   [name] reports he has followed up with the [facility] about this about a year and a half ago and was participating in talk therapy. Unfortunately, due to contracting Covid-19 and having to cancel future appointments at that time, it became hard to resume these sessions. At this time he is interested in trialing a medication for this especially due to an upcoming work event that involves being in a large crowd. Denies SI/HI.    Chronic Issues: IBS [name] states he has been dealing with this issue following his return from [place] years ago. Initially found that he had IBS with diarrhea and abdominal pain which resolves following a BM. In an effort to manage his sx, he did trial pepcid for his sx but discontinued use of this medication due to feelings on nausea. Although he was no longer taking medication, he noticed that he did not experience any GI upset when in taking carbs and coca-cola.   Despite knowing this information, he reports his sx, particularly diarrhea, has worsened this past month. [name] does admit he has been participating in a 30 day challenge that includes healthy eating and believes this could be exacerbating his sx. At  this time, he is interested in trialing another medication that could improve his sx without having to sacrifice a healthy diet. Pt does have a hx of cholecystectomy, but has noticed no bowel changes. Denies rectal bleeding, hematochezia, or melena.   Health Maintenance: Immunizations -- Covid- Due Influenza- Due; 2019 Tdap- Due; 11/2011 Colonoscopy -- Will start at age 48  PSA --  Deferred Diet -- Eats all food groups; Recently limited carb/soda intake  1 gallon of water per day due to challenge Sleep habits -- No concerns Exercise -- Running 5 days x week; Participating in 30 day challenge Weight -- Stable Weight history Wt Readings from Last 10 Encounters:  06/27/21 256 lb (116.1 kg)  10/07/19 253 lb 9.6 oz (115 kg)  10/01/19 252 lb (114.3 kg)  09/04/19 245 lb (111.1 kg)  09/02/19 245 lb (111.1 kg)  07/29/18 262 lb (118.8 kg)  11/12/14 260 lb (117.9 kg)  01/07/13 251 lb (113.9 kg)   Body mass index is 34.24 kg/m. Mood -- Stable despite anxiety; Denies SI/HI Tobacco use --  Tobacco Use: Medium Risk   Smoking Tobacco Use: Former   Smokeless Tobacco Use: Never   Passive Exposure: Not on file    Alcohol use ---  reports current alcohol use.   Depression screen [facility] 2/9 06/27/2021  Decreased Interest 0  Down, Depressed, Hopeless 0  PHQ - 2 Score 0  Altered sleeping -  Tired, decreased energy -  Change in appetite -  Feeling bad or  failure about yourself  -  Trouble concentrating -  Moving slowly or fidgety/restless -  Suicidal thoughts -  PHQ-9 Score -  Difficult doing work/chores -     Other providers/specialists: Patient Care Team: Inda Coke, [place] as PCP - General (Physician Assistant)   PMHx, SurgHx, SocialHx, Medications, and Allergies were reviewed in the Visit Navigator and updated as appropriate.   Past Medical History:  Diagnosis Date   Allergic rhinitis    Chicken pox    Family history of pancreatic cancer 07/29/2018   Family history of  pancreatic cancer    Hyperlipidemia    Irritable bowel syndrome 07/29/2018   diarrhea     Past Surgical History:  Procedure Laterality Date   CHOLECYSTECTOMY  May 2021   ROTATOR CUFF REPAIR Bilateral      Family History  Problem Relation Age of Onset   Alcohol abuse Mother    Pancreatic cancer Mother 65   Early death Mother    Heart disease Father    Cancer Father        Lung   Alcohol abuse Father    Hypertension Father    Hyperlipidemia Father    Alcohol abuse Sister    Stroke Maternal Grandfather    Non-Hodgkin's lymphoma Paternal Uncle 36   Colon cancer Neg Hx    Prostate cancer Neg Hx     Social History   Tobacco Use   Smoking status: Former    Packs/day: 0.50    Types: Cigarettes    Start date: 05/29/1994    Quit date: 05/29/2004    Years since quitting: 17.0   Smokeless tobacco: Never  Substance Use Topics   Alcohol use: Yes    Comment: Very rarely drink   Drug use: No    Review of Systems:   Review of Systems  Constitutional:  Negative for chills, fever, malaise/fatigue and weight loss.  HENT:  Negative for hearing loss, sinus pain and sore throat.   Respiratory:  Negative for cough and hemoptysis.   Cardiovascular:  Negative for chest pain, palpitations, leg swelling and PND.  Gastrointestinal:  Positive for diarrhea. Negative for abdominal pain, constipation, heartburn, nausea and vomiting.  Genitourinary:  Negative for dysuria, frequency and urgency.  Musculoskeletal:  Negative for back pain, myalgias and neck pain.  Skin:  Negative for itching and rash.  Neurological:  Negative for dizziness, tingling, seizures and headaches.  Endo/Heme/Allergies:  Negative for polydipsia.  Psychiatric/Behavioral:  Negative for depression. The patient is nervous/anxious.    Objective:   Vitals:   06/27/21 0957  BP: 100/70  Pulse: (!) 44  Temp: 97.7 F (36.5 C)  SpO2: 97%   Body mass index is 34.24 kg/m.  General Appearance:  Alert, cooperative, no  distress, appears stated age  Head:  Normocephalic, without obvious abnormality, atraumatic  Eyes:  PERRL, conjunctiva/corneas clear, EOM's intact, fundi benign, both eyes       Ears:  Normal TM's and external ear canals, both ears  Nose: Nares normal, septum midline, mucosa normal, no drainage    or sinus tenderness  Throat: Lips, mucosa, and tongue normal; teeth and gums normal  Neck: Supple, symmetrical, trachea midline, no adenopathy; thyroid:  No enlargement/tenderness/nodules; no carotit bruit or JVD  Back:   Symmetric, no curvature, ROM normal, no CVA tenderness  Lungs:   Clear to auscultation bilaterally, respirations unlabored  Chest wall:  No tenderness or deformity  Heart:  Regular rate and rhythm, S1 and S2 normal, no murmur, rub  or gallop  Abdomen:   Soft, non-tender, bowel sounds active all four quadrants, no masses, no organomegaly  Extremities: Extremities normal, atraumatic, no cyanosis or edema  Prostate: Not done.   Skin: Skin color, texture, turgor normal, no rashes or lesions  Lymph nodes: Cervical, supraclavicular, and axillary nodes normal  Neurologic: CNII-XII grossly intact. Normal strength, sensation and reflexes throughout    Assessment/Plan:   Encounter for general adult medical examination with abnormal findings Today patient counseled on age appropriate routine health concerns for screening and prevention, each reviewed and up to date or declined. Immunizations reviewed and up to date or declined. Labs ordered and reviewed. Risk factors for depression reviewed and negative. Hearing function and visual acuity are intact. ADLs screened and addressed as needed. Functional ability and level of safety reviewed and appropriate. Education, counseling and referrals performed based on assessed risks today. Patient provided with a copy of personalized plan for preventive services.  Situational Anxiety Uncontrolled; no red flags Patient denies SI/HI Trial Buspar 10 mg  three times daily as needed  Informed patient to reach out if not effective  I discussed with patient that if they develop any SI, to tell someone immediately and seek medical attention. Follow up as needed   Irritable bowel syndrome, unspecified type Uncontrolled Trial Bentyl 10 mg 4 times daily as needed  Will refer to GI for further evaluation -- needs screening colonoscopy Worsening precautions advised   Obesity, unspecified classification, unspecified obesity type, unspecified whether serious comorbidity present Encouraged patient to increase daily exercise and maintain well balanced diet   Encounter for screening for other viral diseases  - Hepatitis C Antibody   Special screening for malignant neoplasms, colon Placed referral to GI for completion of colonoscopy    Patient Counseling: [x]   Nutrition: Stressed importance of moderation in sodium/caffeine intake, saturated fat and cholesterol, caloric balance, sufficient intake of fresh fruits, vegetables, and fiber.  [x]   Stressed the importance of regular exercise.   []   Substance Abuse: Discussed cessation/primary prevention of tobacco, alcohol, or other drug use; driving or other dangerous activities under the influence; availability of treatment for abuse.   [x]   Injury prevention: Discussed safety belts, safety helmets, smoke detector, smoking near bedding or upholstery.   []   Sexuality: Discussed sexually transmitted diseases, partner selection, use of condoms, avoidance of unintended pregnancy  and contraceptive alternatives.   [x]   Dental health: Discussed importance of regular tooth brushing, flossing, and dental visits.  [x]   Health maintenance and immunizations reviewed. Please refer to Health maintenance section.    I,Havlyn C Ratchford,acting as a Education administrator for [employer], PA.,have documented all relevant documentation on the behalf of Inda Coke, PA,as directed by  Inda Coke, PA while in the presence of  Inda Coke, [place].  I, Inda Coke, [place], have reviewed all documentation for this visit. The documentation on 06/27/21 for the exam, diagnosis, procedures, and orders are all accurate and complete.   Inda Coke, PA-C [place]

## 2021-06-28 LAB — HEPATITIS C ANTIBODY
Hepatitis C Ab: NONREACTIVE
SIGNAL TO CUT-OFF: 0.04 (ref <1.00)

## 2021-07-14 ENCOUNTER — Encounter: Payer: Self-pay | Admitting: Physician Assistant

## 2021-07-14 ENCOUNTER — Ambulatory Visit: Payer: BC Managed Care – PPO | Admitting: Physician Assistant

## 2021-07-14 VITALS — BP 100/60 | HR 55 | Ht 73.0 in | Wt 257.0 lb

## 2021-07-14 DIAGNOSIS — Z9049 Acquired absence of other specified parts of digestive tract: Secondary | ICD-10-CM

## 2021-07-14 DIAGNOSIS — K580 Irritable bowel syndrome with diarrhea: Secondary | ICD-10-CM | POA: Diagnosis not present

## 2021-07-14 DIAGNOSIS — Z1211 Encounter for screening for malignant neoplasm of colon: Secondary | ICD-10-CM | POA: Diagnosis not present

## 2021-07-14 MED ORDER — NA SULFATE-K SULFATE-MG SULF 17.5-3.13-1.6 GM/177ML PO SOLN: 1.0000 | Freq: Once | ORAL | 0 refills | Status: AC

## 2021-07-14 NOTE — Patient Instructions (Signed)
Try taking Dicyclomine 10 mg every morning.    You have been scheduled for a colonoscopy. Please follow written instructions given to you at your visit today.  Please pick up your prep supplies at the pharmacy within the next 1-3 days. If you use inhalers (even only as needed), please bring them with you on the day of your procedure.  If you are age 47 or younger, your body mass index should be between 19-25. Your Body mass index is 33.91 kg/m. If this is out of the aformentioned range listed, please consider follow up with your Primary Care Provider.   ________________________________________________________  The [facility] providers would like to encourage you to use [facility] to communicate with providers for non-urgent requests or questions.  Due to long hold times on the telephone, sending your provider a message by [facility] may be a faster and more efficient way to get a response.  Please allow 48 business hours for a response.  Please remember that this is for non-urgent requests.  _______________________________________________________

## 2021-07-14 NOTE — Progress Notes (Signed)
Chief Complaint: Diarrhea and screening for colorectal cancer  HPI:    [name] is a 46 year old male with a past medical history as listed below, who was referred to me by Inda Coke, PA for a complaint of diarrhea and need for screening for colorectal cancer.      06/27/2021 patient seen in clinic by his PCP and at that time discussed some IBS with diarrhea and abdominal pain which resolved following a bowel movement.  He tried Pepcid but had some nausea and discontinued.  Describes some worsening of diarrhea over the past month he was participating in a 30-day challenge with healthy eating and believe this was exacerbating his symptoms.  Described history of cholecystectomy.  Also discussed some anxiety since returning from [place].    Today, patient explains that he has had some diarrhea and abdominal cramping ever since returning from [place] in 2006.  He was told around 2013 when he was seen by an [facility] physician that this was IBS and " there was not much to do".  Since then he tells me that he has symptoms pretty much every day, even if he is trying to eat "clean", he tells me the only thing that seems not to bother him is a "bland granola bar and Coke".  Typically 20 to 30 minutes after eating he will have lower abdominal cramping which will resolve after having a softer/liquid stool.  He does take Pepto-Bismol every day which seems to help a little bit.  Due to this time use his stools are slightly dark.  Denies any heartburn or reflux symptoms.  Was prescribed Dicyclomine 10 mg by his PCP but tells me he honestly forgets to take this.    Also status postcholecystectomy in 2021.    Denies fever, chills, weight loss, blood in his stool or symptoms that awaken him from sleep.  Past Medical History:  Diagnosis Date   Allergic rhinitis    Chicken pox    Family history of pancreatic cancer 07/29/2018   Family history of pancreatic cancer    Hyperlipidemia    Irritable bowel syndrome  07/29/2018   diarrhea    Past Surgical History:  Procedure Laterality Date   CHOLECYSTECTOMY  May 2021   ROTATOR CUFF REPAIR Bilateral     Current Outpatient Medications  Medication Sig Dispense Refill   busPIRone (BUSPAR) 10 MG tablet Take 1 tablet (10 mg total) by mouth 3 (three) times daily as needed. 30 tablet 2   dicyclomine (BENTYL) 10 MG capsule Take 1 capsule (10 mg total) by mouth 4 (four) times daily as needed for spasms. 60 capsule 1   fexofenadine (ALLEGRA) 180 MG tablet Take 180 mg by mouth as needed.     ibuprofen (ADVIL) 200 MG tablet Take 200 mg by mouth every 6 (six) hours as needed for mild pain.     Omega-3 Fatty Acids (FISH OIL) 1000 MG CAPS Take 1 capsule by mouth daily.      Turmeric (QC TUMERIC COMPLEX PO) Take 1 capsule by mouth as needed.     No current facility-administered medications for this visit.    Allergies as of 07/14/2021   (No Known Allergies)    Family History  Problem Relation Age of Onset   Alcohol abuse Mother    Pancreatic cancer Mother 50   Early death Mother    Heart disease Father    Cancer Father        Lung   Alcohol abuse Father  Hypertension Father    Hyperlipidemia Father    Alcohol abuse Sister    Stroke Maternal Grandfather    Non-Hodgkin's lymphoma Paternal Uncle 45   Colon cancer Neg Hx    Prostate cancer Neg Hx     Social History   Socioeconomic History   Marital status: Married    Spouse name: Not on file   Number of children: Not on file   Years of education: Not on file   Highest education level: Not on file  Occupational History    Employer: [employer]    Comment: Finance  Tobacco Use   Smoking status: Former    Packs/day: 0.50    Types: Cigarettes    Start date: 05/29/1994    Quit date: 05/29/2004    Years since quitting: 17.1   Smokeless tobacco: Never  Substance and Sexual Activity   Alcohol use: Yes    Comment: Very rarely drink   Drug use: No   Sexual activity: Yes    Partners: Female   Other Topics Concern   Not on file  Social History Narrative   Works for [employer]   Married   3 children   Social Determinants of Radio broadcast assistant Strain: Not on file  Food Insecurity: Not on file  Transportation Needs: Not on file  Physical Activity: Not on file  Stress: Not on file  Social Connections: Not on file  Intimate Partner Violence: Not on file    Review of Systems:    Constitutional: No weight loss, fever or chills Skin: No rash Cardiovascular: No chest pain Respiratory: No SOB Gastrointestinal: See HPI and otherwise negative Genitourinary: No dysuria Neurological: No headache, dizziness or syncope Musculoskeletal: No new muscle or joint pain Hematologic: No bleeding  Psychiatric:+anxiety   Physical Exam:  Vital signs: BP 100/60    Pulse (!) 55    Ht 6\' 1"  (1.854 m)    Wt 257 lb (116.6 kg)    BMI 33.91 kg/m    Constitutional:   Pleasant Caucasian male appears to be in NAD, Well developed, Well nourished, alert and cooperative Head:  Normocephalic and atraumatic. Eyes:   PEERL, EOMI. No icterus. Conjunctiva pink. Ears:  Normal auditory acuity. Neck:  Supple Throat: Oral cavity and pharynx without inflammation, swelling or lesion.  Respiratory: Respirations even and unlabored. Lungs clear to auscultation bilaterally.   No wheezes, crackles, or rhonchi.  Cardiovascular: Normal S1, S2. No MRG. Regular rate and rhythm. No peripheral edema, cyanosis or pallor.  Gastrointestinal:  Soft, nondistended, nontender. No rebound or guarding. Normal bowel sounds. No appreciable masses or hepatomegaly. Rectal:  Not performed.  Msk:  Symmetrical without gross deformities. Without edema, no deformity or joint abnormality.  Neurologic:  Alert and  oriented x4;  grossly normal neurologically.  Skin:   Dry and intact without significant lesions or rashes. Psychiatric: Demonstrates good judgement and reason without abnormal affect or behaviors.  RELEVANT LABS AND  IMAGING: CBC    Component Value Date/Time   WBC 7.0 06/27/2021 1051   RBC 4.72 06/27/2021 1051   HGB 14.2 06/27/2021 1051   HCT 42.5 06/27/2021 1051   PLT 205.0 06/27/2021 1051   MCV 90.1 06/27/2021 1051   MCH 31.9 09/02/2019 1843   MCHC 33.4 06/27/2021 1051   RDW 13.1 06/27/2021 1051   LYMPHSABS 2.1 06/27/2021 1051   MONOABS 0.3 06/27/2021 1051   EOSABS 0.1 06/27/2021 1051   BASOSABS 0.0 06/27/2021 1051    CMP     Component  Value Date/Time   NA 137 06/27/2021 1051   K 4.6 06/27/2021 1051   CL 103 06/27/2021 1051   CO2 26 06/27/2021 1051   GLUCOSE 84 06/27/2021 1051   BUN 20 06/27/2021 1051   CREATININE 1.19 06/27/2021 1051   CALCIUM 10.0 06/27/2021 1051   PROT 7.4 06/27/2021 1051   ALBUMIN 4.8 06/27/2021 1051   AST 22 06/27/2021 1051   ALT 32 06/27/2021 1051   ALKPHOS 43 06/27/2021 1051   BILITOT 0.6 06/27/2021 1051   GFRNONAA 59 (L) 09/02/2019 1843   GFRAA >60 09/02/2019 1843    Assessment: 1.  Screening for colorectal cancer: Patient is 34 and never had screening for colorectal cancer 2.  Status postcholecystectomy: In 2021, could be contributing to diarrhea 3.  IBS-D: Symptoms since returning from [place], does have some anxiety related to this, typically abdominal cramping with loose stools  Plan: 1.  Discussed with patient that his symptoms sound most consistent with IBS but during colonoscopy we can rule out other causes for chronic diarrhea. 2.  Scheduled patient for screening colonoscopy in the [place] with Dr. Lorenso Courier.  Did provide the patient a detailed list of risks for the procedure and he agrees to proceed. Patient is appropriate for endoscopic procedure(s) in the ambulatory ([address]) setting.  3.  Discussed with patient that he should trial his Dicyclomine 10 mg every morning, discussed titration up of this up to 4 times a day if needed.  It would be helpful to see if this medicine helps his symptoms.  If not could trial of Cholestyramine. 4.  Patient to follow in  clinic per recommendations from Dr. Lorenso Courier after time of procedure.  Would recommend that he follow-up given that he has IBS symptoms.  Ellouise Newer, PA-C [facility] 07/14/2021, 8:30 AM  Cc: Inda Coke, [place]

## 2021-07-14 NOTE — Progress Notes (Signed)
I agree with the assessment and plan as outlined by [name]. 

## 2021-07-25 DIAGNOSIS — H65192 Other acute nonsuppurative otitis media, left ear: Secondary | ICD-10-CM | POA: Diagnosis not present

## 2021-07-25 DIAGNOSIS — R051 Acute cough: Secondary | ICD-10-CM | POA: Diagnosis not present

## 2021-08-23 ENCOUNTER — Encounter: Payer: Self-pay | Admitting: Internal Medicine

## 2021-08-29 ENCOUNTER — Encounter: Payer: Self-pay | Admitting: Internal Medicine

## 2021-08-29 ENCOUNTER — Ambulatory Visit (AMBULATORY_SURGERY_CENTER): Payer: BC Managed Care – PPO | Admitting: Internal Medicine

## 2021-08-29 VITALS — BP 107/68 | HR 46 | Temp 97.8°F | Resp 13 | Ht 73.0 in | Wt 257.0 lb

## 2021-08-29 DIAGNOSIS — K635 Polyp of colon: Secondary | ICD-10-CM

## 2021-08-29 DIAGNOSIS — D122 Benign neoplasm of ascending colon: Secondary | ICD-10-CM

## 2021-08-29 DIAGNOSIS — Z1211 Encounter for screening for malignant neoplasm of colon: Secondary | ICD-10-CM | POA: Diagnosis not present

## 2021-08-29 DIAGNOSIS — D125 Benign neoplasm of sigmoid colon: Secondary | ICD-10-CM

## 2021-08-29 MED ORDER — SODIUM CHLORIDE 0.9 % IV SOLN: 500.0000 mL | Freq: Once | INTRAVENOUS | Status: DC

## 2021-08-29 NOTE — Patient Instructions (Addendum)
Handouts were given to your care partner on polyps, hemorrhoids and Irritable Bowel Syndrome. ?You may resume your current medications today. ?Await biopsy results.  May take 1-3 weeks to receive pathology results. ?Follow up in the clinic in 1 month to discuss issues with Reflux.  Please Call to schedule this appointment. ?Please call if any questions or concerns. ?  ? ? ? ?YOU HAD AN ENDOSCOPIC PROCEDURE TODAY AT [facility]:   Refer to the procedure report that was given to you for any specific questions about what was found during the examination.  If the procedure report does not answer your questions, please call your gastroenterologist to clarify.  If you requested that your care partner not be given the details of your procedure findings, then the procedure report has been included in a sealed envelope for you to review at your convenience later. ? ?YOU SHOULD EXPECT: Some feelings of bloating in the abdomen. Passage of more gas than usual.  Walking can help get rid of the air that was put into your GI tract during the procedure and reduce the bloating. If you had a lower endoscopy (such as a colonoscopy or flexible sigmoidoscopy) you may notice spotting of blood in your stool or on the toilet paper. If you underwent a bowel prep for your procedure, you may not have a normal bowel movement for a few days. ? ?Please Note:  You might notice some irritation and congestion in your nose or some drainage.  This is from the oxygen used during your procedure.  There is no need for concern and it should clear up in a day or so. ? ?SYMPTOMS TO REPORT IMMEDIATELY: ? ?Following lower endoscopy (colonoscopy or flexible sigmoidoscopy): ? Excessive amounts of blood in the stool ? Significant tenderness or worsening of abdominal pains ? Swelling of the abdomen that is new, acute ? Fever of 100?F or higher ? ? ? ?For urgent or emergent issues, a gastroenterologist can be reached at any hour by calling [phone]. ?Do not use MyChart messaging for urgent concerns.  ? ? ?DIET:  We do recommend a small meal at first, but then you may proceed to your regular diet.  Drink plenty of fluids but you should avoid alcoholic beverages for 24 hours. ? ?ACTIVITY:  You should plan to take it easy for the rest of today and you should NOT DRIVE or use heavy machinery until tomorrow (because of the sedation medicines used during the test).   ? ?FOLLOW UP: ?Our staff will call the number listed on your records 48-72 hours following your procedure to check on you and address any questions or concerns that you may have regarding the information given to you following your procedure. If we do not reach you, we will leave a message.  We will attempt to reach you two times.  During this call, we will ask if you have developed any symptoms of COVID 19. If you develop any symptoms (ie: fever, flu-like symptoms, shortness of breath, cough etc.) before then, please call [phone].  If you test positive for Covid 19 in the 2 weeks post procedure, please call and report this information to [place].   ? ?If any biopsies were taken you will be contacted by phone or by letter within the next 1-3 weeks.  Please call us at [phone] if you have not heard about the biopsies in 3 weeks.  ? ? ?SIGNATURES/CONFIDENTIALITY: ?You and/or your care partner have signed paperwork which  will be entered into your electronic medical record.  These signatures attest to the fact that that the information above on your After Visit Summary has been reviewed and is understood.  Full responsibility of the confidentiality of this discharge information lies with you and/or your care-partner.  ?

## 2021-08-29 NOTE — Progress Notes (Signed)
? ?GASTROENTEROLOGY PROCEDURE H&P NOTE  ? ?Primary Care Physician: ?Inda Coke, PA ? ? ? ?Reason for Procedure:   Colon cancer screening ? ?Plan:    Colonoscopy ? ?Patient is appropriate for endoscopic procedure(s) in the ambulatory ([facility]) setting. ? ?The nature of the procedure, as well as the risks, benefits, and alternatives were carefully and thoroughly reviewed with the patient. Ample time for discussion and questions allowed. The patient understood, was satisfied, and agreed to proceed.  ? ? ? ?HPI: ?[name] is a 46 y.o. male who presents for colonoscopy for colon cancer screening. Patient was last seen in GI clinic on 07/14/21. ? ?Past Medical History:  ?Diagnosis Date  ? Allergic rhinitis   ? Allergy   ? Anxiety   ? Chicken pox   ? Chronic headaches   ? Family history of pancreatic cancer 07/29/2018  ? Gallstones   ? Hyperlipidemia   ? Irritable bowel syndrome 07/29/2018  ? diarrhea  ? ? ?Past Surgical History:  ?Procedure Laterality Date  ? CHOLECYSTECTOMY  May 2021  ? FOOT SURGERY Right   ? reattached his plantars nerve  ? ROTATOR CUFF REPAIR Bilateral   ? ? ?Prior to Admission medications   ?Medication Sig Start Date End Date Taking? Authorizing Provider  ?dicyclomine (BENTYL) 10 MG capsule Take 1 capsule (10 mg total) by mouth 4 (four) times daily as needed for spasms. 06/27/21   Inda Coke, PA  ?fexofenadine (ALLEGRA) 180 MG tablet Take 180 mg by mouth as needed. ?Patient not taking: Reported on 08/29/2021    [provider]  ?ibuprofen (ADVIL) 200 MG tablet Take 200 mg by mouth every 6 (six) hours as needed for mild pain.    [provider]  ?Omega-3 Fatty Acids (FISH OIL) 1000 MG CAPS Take 1 capsule by mouth daily.     [provider]  ?Turmeric (QC TUMERIC COMPLEX PO) Take 1 capsule by mouth as needed.    [provider]  ? ? ?Current Outpatient Medications  ?Medication Sig Dispense Refill  ? dicyclomine (BENTYL) 10 MG capsule Take 1 capsule (10  mg total) by mouth 4 (four) times daily as needed for spasms. 60 capsule 1  ? fexofenadine (ALLEGRA) 180 MG tablet Take 180 mg by mouth as needed. (Patient not taking: Reported on 08/29/2021)    ? ibuprofen (ADVIL) 200 MG tablet Take 200 mg by mouth every 6 (six) hours as needed for mild pain.    ? Omega-3 Fatty Acids (FISH OIL) 1000 MG CAPS Take 1 capsule by mouth daily.     ? Turmeric (QC TUMERIC COMPLEX PO) Take 1 capsule by mouth as needed.    ? ?Current Facility-Administered Medications  ?Medication Dose Route Frequency Provider Last Rate Last Admin  ? 0.9 %  sodium chloride infusion  500 mL Intravenous Once Sharyn Creamer, MD      ? ? ?Allergies as of 08/29/2021  ? (No Known Allergies)  ? ? ?Family History  ?Problem Relation Age of Onset  ? Alcohol abuse Mother   ? Pancreatic cancer Mother 72  ? Early death Mother   ? Heart disease Father   ? Cancer Father   ?     Lung, heavy smoker  ? Alcohol abuse Father   ? Hypertension Father   ? Hyperlipidemia Father   ? Alcohol abuse Sister   ? Stroke Maternal Grandfather   ? Non-Hodgkin's lymphoma Paternal Uncle 4  ?     linked to agent orange  ? Colon  cancer Neg Hx   ? Prostate cancer Neg Hx   ? Esophageal cancer Neg Hx   ? ? ?Social History  ? ?Socioeconomic History  ? Marital status: Married  ?  Spouse name: Not on file  ? Number of children: 3  ? Years of education: Not on file  ? Highest education level: Not on file  ?Occupational History  ?  Employer: [employer]  ?  Comment: Finance  ?Tobacco Use  ? Smoking status: Former  ?  Packs/day: 0.50  ?  Types: Cigarettes  ?  Start date: 05/29/1994  ?  Quit date: 05/29/2004  ?  Years since quitting: 17.2  ? Smokeless tobacco: Never  ?Vaping Use  ? Vaping Use: Never used  ?Substance and Sexual Activity  ? Alcohol use: Yes  ?  Comment: Very rarely drink  ? Drug use: No  ? Sexual activity: Yes  ?  Partners: Female  ?Other Topics Concern  ? Not on file  ?Social History Narrative  ? Works for [employer]  ? Married  ? 3 children  ? ?Social  Determinants of Health  ? ?Financial Resource Strain: Not on file  ?Food Insecurity: Not on file  ?Transportation Needs: Not on file  ?Physical Activity: Not on file  ?Stress: Not on file  ?Social Connections: Not on file  ?Intimate Partner Violence: Not on file  ? ? ?Physical Exam: ?Vital signs in last 24 hours: ?BP 122/64   Pulse (!) 53   Temp 97.8 ?F (36.6 ?C)   Ht '6\' 1"'$  (1.854 m)   Wt 257 lb (116.6 kg)   SpO2 99%   BMI 33.91 kg/m?  ?GEN: NAD ?EYE: Sclerae anicteric ?ENT: MMM ?CV: Non-tachycardic ?Pulm: No increased work of breathing ?GI: Soft, NT/ND ?NEURO:  Alert & Oriented ? ? ?Christia Reading, MD ?[facility] ? ?08/29/2021 8:05 AM ? ?

## 2021-08-29 NOTE — Op Note (Addendum)
[place] [facility] ?Patient Name: [name] ?Procedure Date: 08/29/2021 7:12 AM ?MRN: [id number] ?Endoscopist: Sonny Masters "Christia Reading ,  ?Age: 46 ?Referring MD:  ?Date of Birth: 03/08/1976 ?Gender: Male ?Account #: [id number] ?Procedure:                Colonoscopy ?Indications:              Screening for colorectal malignant neoplasm, This  ?                          is the patient's first colonoscopy ?Medicines:                Monitored Anesthesia Care ?Procedure:                Pre-Anesthesia Assessment: ?                          - Prior to the procedure, a History and Physical  ?                          was performed, and patient medications and  ?                          allergies were reviewed. The patient's tolerance of  ?                          previous anesthesia was also reviewed. The risks  ?                          and benefits of the procedure and the sedation  ?                          options and risks were discussed with the patient.  ?                          All questions were answered, and informed consent  ?                          was obtained. Prior Anticoagulants: The patient has  ?                          taken no previous anticoagulant or antiplatelet  ?                          agents. ASA Grade Assessment: II - A patient with  ?                          mild systemic disease. After reviewing the risks  ?                          and benefits, the patient was deemed in  ?                          satisfactory condition to undergo the procedure. ?  After obtaining informed consent, the colonoscope  ?                          was passed under direct vision. Throughout the  ?                          procedure, the patient's blood pressure, pulse, and  ?                          oxygen saturations were monitored continuously. The  ?                          Colonoscope was introduced through the anus and  ?                          advanced to the the  terminal ileum. The colonoscopy  ?                          was performed without difficulty. The patient  ?                          tolerated the procedure well. The quality of the  ?                          bowel preparation was excellent. The terminal  ?                          ileum, ileocecal valve, appendiceal orifice, and  ?                          rectum were photographed. ?Scope In: 8:11:26 AM ?Scope Out: 8:32:07 AM ?Scope Withdrawal Time: 0 hours 16 minutes 8 seconds  ?Total Procedure Duration: 0 hours 20 minutes 41 seconds  ?Findings:                 The terminal ileum appeared normal. ?                          A 8 mm polyp was found in the ascending colon. The  ?                          polyp was sessile. The polyp was removed with a  ?                          cold snare. Resection and retrieval were complete. ?                          A 10 mm polyp was found in the sigmoid colon. The  ?                          polyp was sessile. The polyp was removed with a  ?                          cold snare.  Resection and retrieval were complete. ?                          Biopsies for histology were taken with a cold  ?                          forceps from the ascending colon, transverse colon,  ?                          descending colon and sigmoid colon for evaluation  ?                          of microscopic colitis. ?                          Non-bleeding internal hemorrhoids were found during  ?                          retroflexion. ?Complications:            No immediate complications. ?Estimated Blood Loss:     Estimated blood loss was minimal. ?Impression:               - The examined portion of the ileum was normal. ?                          - One 8 mm polyp in the ascending colon, removed  ?                          with a cold snare. Resected and retrieved. ?                          - One 10 mm polyp in the sigmoid colon, removed  ?                          with a cold snare. Resected and  retrieved. ?                          - Non-bleeding internal hemorrhoids. ?                          - Biopsies were taken with a cold forceps from the  ?                          ascending colon, transverse colon, descending colon  ?                          and sigmoid colon for evaluation of microscopic  ?                          colitis. ?Recommendation:           - Discharge patient to home (with escort). ?                          - Await pathology results. ?                          -  The findings and recommendations were discussed  ?                          with the patient. ?                          - Return to GI clinic in 1 month. ?Georgian Co,  ?08/29/2021 8:36:07 AM ?

## 2021-08-29 NOTE — Progress Notes (Signed)
VS by DT    

## 2021-08-29 NOTE — Progress Notes (Signed)
To pacu, VSS. Report to [email or web address].tb 

## 2021-08-29 NOTE — Progress Notes (Signed)
Vocal order per Dr. Lorenso Courier, Pt to call to schedule a follow up appointment in clinic for 1 month to discuss reflux issues. ? ?Pt and his wife reported his HR was normally on the low side.  I told them HR was in the 40's, "it's normally", they both responded.   ? ?No problems noted in the recovery room. maw  ?

## 2021-08-31 ENCOUNTER — Telehealth: Payer: Self-pay

## 2021-08-31 ENCOUNTER — Encounter: Payer: Self-pay | Admitting: Internal Medicine

## 2021-08-31 NOTE — Telephone Encounter (Signed)
?  Follow up Call- ? ? ?  08/29/2021  ?  7:19 AM  ?Call back number  ?Post procedure Call Back phone  # [phone]  ?Permission to leave phone message Yes  ?  ? ?Patient questions: ? ?Do you have a fever, pain , or abdominal swelling? No. ?Pain Score  0 * ? ?Have you tolerated food without any problems? Yes.   ? ?Have you been able to return to your normal activities? Yes.   ? ?Do you have any questions about your discharge instructions: ?Diet   No. ?Medications  No. ?Follow up visit  No. ? ?Do you have questions or concerns about your Care? No. ? ?Actions: ?* If pain score is 4 or above: ?No action needed, pain <4. ? ? ?

## 2021-10-05 ENCOUNTER — Ambulatory Visit: Payer: BC Managed Care – PPO | Admitting: Internal Medicine

## 2021-11-16 ENCOUNTER — Emergency Department (HOSPITAL_BASED_OUTPATIENT_CLINIC_OR_DEPARTMENT_OTHER): Payer: BC Managed Care – PPO | Admitting: Radiology

## 2021-11-16 ENCOUNTER — Emergency Department (HOSPITAL_BASED_OUTPATIENT_CLINIC_OR_DEPARTMENT_OTHER)
Admission: EM | Admit: 2021-11-16 | Discharge: 2021-11-16 | Disposition: A | Discharge status: Home / Self Care | Payer: BC Managed Care – PPO | Attending: Emergency Medicine | Admitting: Emergency Medicine

## 2021-11-16 ENCOUNTER — Emergency Department (HOSPITAL_BASED_OUTPATIENT_CLINIC_OR_DEPARTMENT_OTHER): Payer: BC Managed Care – PPO

## 2021-11-16 ENCOUNTER — Other Ambulatory Visit: Payer: Self-pay

## 2021-11-16 ENCOUNTER — Encounter (HOSPITAL_BASED_OUTPATIENT_CLINIC_OR_DEPARTMENT_OTHER): Payer: Self-pay

## 2021-11-16 DIAGNOSIS — Z87891 Personal history of nicotine dependence: Secondary | ICD-10-CM | POA: Diagnosis not present

## 2021-11-16 DIAGNOSIS — R072 Precordial pain: Secondary | ICD-10-CM | POA: Insufficient documentation

## 2021-11-16 DIAGNOSIS — R079 Chest pain, unspecified: Secondary | ICD-10-CM | POA: Diagnosis not present

## 2021-11-16 DIAGNOSIS — N2889 Other specified disorders of kidney and ureter: Secondary | ICD-10-CM | POA: Diagnosis not present

## 2021-11-16 LAB — BASIC METABOLIC PANEL
Anion gap: 9 (ref 5–15)
BUN: 18 mg/dL (ref 6–20)
CO2: 25 mmol/L (ref 22–32)
Calcium: 10.2 mg/dL (ref 8.9–10.3)
Chloride: 104 mmol/L (ref 98–111)
Creatinine, Ser: 1.07 mg/dL (ref 0.61–1.24)
GFR, Estimated: >60 mL/min (ref >60)
Glucose, Bld: 85 mg/dL (ref 70–99)
Potassium: 4.2 mmol/L (ref 3.5–5.1)
Sodium: 138 mmol/L (ref 135–145)

## 2021-11-16 LAB — CBC
HCT: 39.4 % (ref 39.0–52.0)
Hemoglobin: 13.4 g/dL (ref 13.0–17.0)
MCH: 30.1 pg (ref 26.0–34.0)
MCHC: 34 g/dL (ref 30.0–36.0)
MCV: 88.5 fL (ref 80.0–100.0)
Platelets: 200 10*3/uL (ref 150–400)
RBC: 4.45 MIL/uL (ref 4.22–5.81)
RDW: 12.6 % (ref 11.5–15.5)
WBC: 6.4 10*3/uL (ref 4.0–10.5)
nRBC: 0 % (ref 0.0–0.2)

## 2021-11-16 LAB — TROPONIN I (HIGH SENSITIVITY)
Troponin I (High Sensitivity): 3 ng/L (ref <18)
Troponin I (High Sensitivity): 3 ng/L (ref <18)

## 2021-11-16 MED ORDER — IOHEXOL 350 MG/ML SOLN
100.0000 mL | Freq: Once | INTRAVENOUS | Status: AC | PRN
  Administered 2021-11-16: 100 mL via INTRAVENOUS

## 2021-11-16 NOTE — ED Triage Notes (Signed)
Patient here POV from Home.  Endorses CP Intermittently since Monday Night. Awoke from Sleep on Monday and then lessened a Few Hours Later. Originally was Also in Back.  Worsened again this AM and was once again associated with Back pain.   No N/V/D. No Fevers.   NAD Noted during Triage. A&Ox4. GCS 15. Ambulatory.

## 2021-11-16 NOTE — ED Provider Notes (Signed)
[facility] EMERGENCY DEPT Provider Note   CSN: [id number] Arrival date & time: 11/16/21  1332     History  Chief Complaint  Patient presents with   Chest Pain    [name] is a 46 y.o. male.  Patient with the onset of substernal chest pain at about 2:30 in the morning on Tuesday.  Radiates to the right back area.  Pain initially was lasted about 3 2 to 3 hours.  In between it was a dull ache and then starting this morning it was a sharp pain again for 1 hour.  And now back to just being dull.  Not associated with shortness of breath not associated with nausea or vomiting.  Patient is never had pain like this before.  Patient is a former smoker.  There is a is a history of lung cancer in his family.  No known heart disease with this patient.  Patient a history of irritable bowel disease has had his gallbladder removed in May 2021.  Also family history of pancreatic cancer hyperlipidemia.       Home Medications Prior to Admission medications   Medication Sig Start Date End Date Taking? Authorizing Provider  dicyclomine (BENTYL) 10 MG capsule Take 1 capsule (10 mg total) by mouth 4 (four) times daily as needed for spasms. 06/27/21   Inda Coke, PA  fexofenadine (ALLEGRA) 180 MG tablet Take 180 mg by mouth as needed. Patient not taking: Reported on 08/29/2021    [provider]  ibuprofen (ADVIL) 200 MG tablet Take 200 mg by mouth every 6 (six) hours as needed for mild pain.    [provider]  Omega-3 Fatty Acids (FISH OIL) 1000 MG CAPS Take 1 capsule by mouth daily.     [provider]  Turmeric (QC TUMERIC COMPLEX PO) Take 1 capsule by mouth as needed.    [provider]      Allergies    Patient has no known allergies.    Review of Systems   Review of Systems  Constitutional:  Negative for chills and fever.  HENT:  Negative for ear pain and sore throat.   Eyes:  Negative for pain and visual disturbance.   Respiratory:  Negative for cough and shortness of breath.   Cardiovascular:  Positive for chest pain. Negative for palpitations.  Gastrointestinal:  Negative for abdominal pain and vomiting.  Genitourinary:  Negative for dysuria and hematuria.  Musculoskeletal:  Positive for back pain. Negative for arthralgias.  Skin:  Negative for color change and rash.  Neurological:  Negative for seizures and syncope.  All other systems reviewed and are negative.   Physical Exam Updated Vital Signs BP 122/69 (BP Location: Right Arm)   Pulse (!) 53   Temp (!) 96.3 F (35.7 C) (Temporal)   Resp 16   Ht 1.854 m ('6\' 1"'$ )   Wt 116.6 kg   SpO2 100%   BMI 33.91 kg/m  Physical Exam Vitals and nursing note reviewed.  Constitutional:      General: He is not in acute distress.    Appearance: He is well-developed. He is not ill-appearing or toxic-appearing.  HENT:     Head: Normocephalic and atraumatic.  Eyes:     Conjunctiva/sclera: Conjunctivae normal.  Cardiovascular:     Rate and Rhythm: Normal rate and regular rhythm.     Heart sounds: No murmur heard.    No systolic murmur is present.  Pulmonary:     Effort: Pulmonary effort is normal. No  respiratory distress.     Breath sounds: Normal breath sounds. No decreased breath sounds, wheezing, rhonchi or rales.  Chest:     Chest wall: No tenderness.  Abdominal:     Palpations: Abdomen is soft.     Tenderness: There is no abdominal tenderness.  Musculoskeletal:        General: No swelling.     Cervical back: Neck supple.     Right lower leg: No edema.  Skin:    General: Skin is warm and dry.     Capillary Refill: Capillary refill takes less than 2 seconds.  Neurological:     General: No focal deficit present.     Mental Status: He is alert and oriented to person, place, and time.  Psychiatric:        Mood and Affect: Mood normal.     ED Results / Procedures / Treatments   Labs (all labs ordered are listed, but only abnormal results  are displayed) Labs Reviewed  BASIC METABOLIC PANEL  CBC  TROPONIN I (HIGH SENSITIVITY)  TROPONIN I (HIGH SENSITIVITY)    EKG EKG Interpretation  Date/Time:  Wednesday November 16 2021 13:39:50 EDT Ventricular Rate:  49 PR Interval:  208 QRS Duration: 98 QT Interval:  426 QTC Calculation: 384 R Axis:   -6 Text Interpretation: Sinus bradycardia Otherwise normal ECG When compared with ECG of 02-Sep-2019 21:42, PREVIOUS ECG IS PRESENT No significant change since last tracing Confirmed by Fredia Sorrow [phone]) on 11/16/2021 3:13:44 PM  Radiology CT Angio Chest PE W/Cm &/Or Wo Cm  Result Date: 11/16/2021 CLINICAL DATA:  Pulmonary embolism (PE) suspected, high prob. Patient c/o chest pain that radiates into his back, some fatigue EXAM: CT ANGIOGRAPHY CHEST WITH CONTRAST TECHNIQUE: Multidetector CT imaging of the chest was performed using the standard protocol during bolus administration of intravenous contrast. Multiplanar CT image reconstructions and MIPs were obtained to evaluate the vascular anatomy. RADIATION DOSE REDUCTION: This exam was performed according to the departmental dose-optimization program which includes automated exposure control, adjustment of the mA and/or kV according to patient size and/or use of iterative reconstruction technique. CONTRAST:  18m OMNIPAQUE IOHEXOL 350 MG/ML SOLN COMPARISON:  None Available. FINDINGS: Cardiovascular: Satisfactory opacification of the pulmonary arteries to the segmental level. No evidence of pulmonary embolism. The main pulmonary artery is normal in caliber. Normal heart size. No significant pericardial effusion. The thoracic aorta is normal in caliber. No atherosclerotic plaque of the thoracic aorta. No coronary artery calcifications. Mediastinum/Nodes: No enlarged mediastinal, hilar, or axillary lymph nodes. Thyroid gland, trachea, and esophagus demonstrate no significant findings. Lungs/Pleura: No focal consolidation. No pulmonary nodule. No  pulmonary mass. No pleural effusion. No pneumothorax. Upper Abdomen: Partially visualized at least 3.9 x 4.4 cm left hypodense renal lesion with a density of 36 Hounsfield units. There is an ill-defined 3.6 x 3.4 cm right posterior hepatic lobe hypodense lesion with a density of 26 Hounsfield units. Status post cholecystectomy. Musculoskeletal: No chest wall abnormality. No suspicious lytic or blastic osseous lesions. No acute displaced fracture. Multilevel degenerative changes of the spine. Review of the MIP images confirms the above findings. IMPRESSION: 1. No pulmonary embolus. 2. No acute intrathoracic abnormality. 3. Partially visualized indeterminate 4.4 cm left renal lesion. When the patient is clinically stable and able to follow directions and hold their breath (preferably as an outpatient) further evaluation with dedicated abdominal MRI renal should be considered. 4. Indeterminate 3.6 right hepatic lobe hypodensity. This can be further evaluated on the MRI. Electronically Signed  By: Iven Finn M.D.   On: 11/16/2021 17:16   DG Chest 2 View  Result Date: 11/16/2021 CLINICAL DATA:  Chest pain.  Former smoker EXAM: CHEST - 2 VIEW COMPARISON:  None Available. FINDINGS: Normal mediastinum and cardiac silhouette. Normal pulmonary vasculature. No evidence of effusion, infiltrate, or pneumothorax. No acute bony abnormality. IMPRESSION: No acute cardiopulmonary process. Electronically Signed   By: Suzy Bouchard M.D.   On: 11/16/2021 14:36    Procedures Procedures    Medications Ordered in ED Medications  iohexol (OMNIPAQUE) 350 MG/ML injection 100 mL (100 mLs Intravenous Contrast Given 11/16/21 1647)    ED Course/ Medical Decision Making/ A&P                           Medical Decision Making Amount and/or Complexity of Data Reviewed Labs: ordered. Radiology: ordered.  Risk Prescription drug management.   Patient work-up for the chest pain no evidence of acute cardiac event.   Troponins x2 very normal.  Basic metabolic panel normal.  CBC normal no leukocytosis.  Chest x-ray without any acute findings.  EKG without any acute abnormalities.  Patient not currently followed by cardiology.  I think he needs outpatient work-up for echo and nonexercise stress test by cardiology referral made.  CT angio was done because patient concerned about history of lung cancer and also we clinically had some concern about possible pulmonary embolus.  Negative for that but had 2 incidental findings of a spot on the liver and a more concerning larger spot on the left kidney that radiology is not seeing is benign and also saying not consistent with neoplastic process.  But recommending MRI of both areas.  Patient's primary care doctor is with the [facility] and will have him follow-up with her to have the MRIs arranged.  Neck discomfort of the chest pain not clear.  Patient can treat symptomatically.  Final Clinical Impression(s) / ED Diagnoses Final diagnoses:  Precordial pain  Left renal mass    Rx / DC Orders ED Discharge Orders     None         Fredia Sorrow, MD 11/16/21 1835

## 2021-11-16 NOTE — ED Notes (Signed)
RN provided AVS using Teachback Method. Patient verbalizes understanding of Discharge Instructions. Opportunity for Questioning and Answers were provided by RN. Patient Discharged from ED ambulatory to Home with Family. ? ?

## 2021-11-16 NOTE — Discharge Instructions (Signed)
Make an appointment follow-up with your primary care doctor for further evaluation and is an incidental finding of a left renal mass that the recommending MRI for.  They are unable to determine exactly what this is.  Work-up for the chest pain troponins x2 negative.  CT scan showed no evidence of any blood clots.  Or any abnormalities in the lungs.  There also was a spot on the liver that they also recommending MRI for.  Return for any new or worse symptoms.

## 2021-11-18 ENCOUNTER — Ambulatory Visit: Payer: BC Managed Care – PPO | Admitting: Family Medicine

## 2021-11-18 ENCOUNTER — Encounter: Payer: Self-pay | Admitting: Family Medicine

## 2021-11-18 VITALS — BP 112/70 | HR 95 | Temp 97.5°F | Ht 73.0 in | Wt 266.4 lb

## 2021-11-18 DIAGNOSIS — R93429 Abnormal radiologic findings on diagnostic imaging of unspecified kidney: Secondary | ICD-10-CM | POA: Diagnosis not present

## 2021-11-18 DIAGNOSIS — R932 Abnormal findings on diagnostic imaging of liver and biliary tract: Secondary | ICD-10-CM

## 2021-11-18 DIAGNOSIS — R0789 Other chest pain: Secondary | ICD-10-CM

## 2021-11-18 DIAGNOSIS — K769 Liver disease, unspecified: Secondary | ICD-10-CM | POA: Diagnosis not present

## 2021-11-18 LAB — URINALYSIS, ROUTINE W REFLEX MICROSCOPIC
Bilirubin Urine: NEGATIVE
Hgb urine dipstick: NEGATIVE
Ketones, ur: NEGATIVE
Leukocytes,Ua: NEGATIVE
Nitrite: NEGATIVE
RBC / HPF: NONE SEEN (ref 0–?)
Specific Gravity, Urine: 1.025 (ref 1.000–1.030)
Total Protein, Urine: NEGATIVE
Urine Glucose: NEGATIVE
Urobilinogen, UA: 0.2 (ref 0.0–1.0)
pH: 5.5 (ref 5.0–8.0)

## 2021-11-18 LAB — HEPATIC FUNCTION PANEL
ALT: 28 U/L (ref 0–53)
AST: 20 U/L (ref 0–37)
Albumin: 4.5 g/dL (ref 3.5–5.2)
Alkaline Phosphatase: 44 U/L (ref 39–117)
Bilirubin, Direct: 0.1 mg/dL (ref 0.0–0.3)
Total Bilirubin: 0.5 mg/dL (ref 0.2–1.2)
Total Protein: 7.3 g/dL (ref 6.0–8.3)

## 2021-11-18 LAB — LIPASE: Lipase: 38 U/L (ref 11.0–59.0)

## 2021-11-18 LAB — AMYLASE: Amylase: 34 U/L (ref 27–131)

## 2021-12-06 ENCOUNTER — Ambulatory Visit
Admission: RE | Admit: 2021-12-06 | Discharge: 2021-12-06 | Disposition: A | Discharge status: Home / Self Care | Payer: BC Managed Care – PPO | Source: Ambulatory Visit | Attending: Family Medicine | Admitting: Family Medicine

## 2021-12-06 DIAGNOSIS — K7689 Other specified diseases of liver: Secondary | ICD-10-CM | POA: Diagnosis not present

## 2021-12-06 DIAGNOSIS — R932 Abnormal findings on diagnostic imaging of liver and biliary tract: Secondary | ICD-10-CM

## 2021-12-06 DIAGNOSIS — D1803 Hemangioma of intra-abdominal structures: Secondary | ICD-10-CM | POA: Diagnosis not present

## 2021-12-06 DIAGNOSIS — R93429 Abnormal radiologic findings on diagnostic imaging of unspecified kidney: Secondary | ICD-10-CM

## 2021-12-06 DIAGNOSIS — K839 Disease of biliary tract, unspecified: Secondary | ICD-10-CM | POA: Diagnosis not present

## 2021-12-06 DIAGNOSIS — K769 Liver disease, unspecified: Secondary | ICD-10-CM

## 2021-12-06 DIAGNOSIS — N2889 Other specified disorders of kidney and ureter: Secondary | ICD-10-CM | POA: Diagnosis not present

## 2021-12-06 MED ORDER — GADOBENATE DIMEGLUMINE 529 MG/ML IV SOLN
20.0000 mL | Freq: Once | INTRAVENOUS | Status: AC | PRN
  Administered 2021-12-06: 20 mL via INTRAVENOUS

## 2021-12-07 ENCOUNTER — Telehealth: Payer: Self-pay

## 2021-12-07 NOTE — Telephone Encounter (Signed)
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

## 2021-12-08 ENCOUNTER — Other Ambulatory Visit: Payer: Self-pay | Admitting: Physician Assistant

## 2021-12-08 DIAGNOSIS — R0789 Other chest pain: Secondary | ICD-10-CM

## 2021-12-08 DIAGNOSIS — C642 Malignant neoplasm of left kidney, except renal pelvis: Secondary | ICD-10-CM

## 2021-12-08 NOTE — Telephone Encounter (Signed)
Noted  

## 2021-12-08 NOTE — Telephone Encounter (Signed)
Please advise 

## 2021-12-08 NOTE — Telephone Encounter (Signed)
Patient's wife Stanton Kidney requests Patient be called at ph# [phone] regarding: Patient received MRI results on MyChart and wants to be advised on next steps.

## 2021-12-16 DIAGNOSIS — D4412 Neoplasm of uncertain behavior of left adrenal gland: Secondary | ICD-10-CM | POA: Diagnosis not present

## 2021-12-18 DIAGNOSIS — H66001 Acute suppurative otitis media without spontaneous rupture of ear drum, right ear: Secondary | ICD-10-CM | POA: Diagnosis not present

## 2021-12-19 ENCOUNTER — Encounter: Payer: Self-pay | Admitting: Internal Medicine

## 2021-12-23 DIAGNOSIS — D49512 Neoplasm of unspecified behavior of left kidney: Secondary | ICD-10-CM | POA: Diagnosis not present

## 2022-01-05 ENCOUNTER — Other Ambulatory Visit: Payer: Self-pay | Admitting: Urology

## 2022-01-09 ENCOUNTER — Ambulatory Visit (HOSPITAL_BASED_OUTPATIENT_CLINIC_OR_DEPARTMENT_OTHER): Payer: BC Managed Care – PPO | Admitting: Cardiology

## 2022-01-09 ENCOUNTER — Encounter (HOSPITAL_BASED_OUTPATIENT_CLINIC_OR_DEPARTMENT_OTHER): Payer: Self-pay | Admitting: Cardiology

## 2022-01-09 VITALS — BP 110/80 | HR 50 | Ht 73.0 in | Wt 272.0 lb

## 2022-01-09 DIAGNOSIS — R079 Chest pain, unspecified: Secondary | ICD-10-CM | POA: Diagnosis not present

## 2022-01-09 DIAGNOSIS — Z7189 Other specified counseling: Secondary | ICD-10-CM | POA: Diagnosis not present

## 2022-01-09 DIAGNOSIS — Z01810 Encounter for preprocedural cardiovascular examination: Secondary | ICD-10-CM | POA: Diagnosis not present

## 2022-01-09 NOTE — Progress Notes (Signed)
Cardiology Office Note:    Date:  01/09/2022   ID:  [name], DOB [birth year], MRN [id number]  PCP:  Inda Coke, PA  Cardiologist:  Buford Dresser, MD  Referring MD: Inda Coke, [place]   Chief Complaint  Patient presents with   Chest Pain    History of Present Illness:    [name] is a 46 y.o. male with a hx of hyperlipidemia, and gallstones, who is seen as a new consult at the request of Inda Coke, [place] for the evaluation and management of atypical chest pain.  He presented to the ED 11/16/21 for chest pain and upper back pain that woke him up from sleep and lasted 2 hours. A CTA of the chest showed no PE but did reveal a partially visualized indeterminate 4.4 cm left renal lesion.  He was seen by Dr. Cherlynn Kaiser on 11/18/2021 where his chest pain had resolved but continued to have some mild right back pain. An MRI of the abdomen was ordered. On 12/07/2021 he communicated by phone with Inda Coke, PA regarding the abdominal MRI finding of renal cell carcinoma (12/06/21). Referrals were placed to urology and cardiology.  He is scheduled for a partial nephrectomy on 02/28/2022.  Chest pain: -Initial onset: A couple years ago, he developed severe back pain that would linger for a while. This was found to be related to gallstones and he underwent cholecystectomy . A couple months ago he began to develop similar, radiating pain that would occur a couple times a month. Usually radiates from his right-sided chest to back. It became more frequent in June and lasted for up to an hour. At one time his pain occurred for 2 hours before subsiding. A few days later it lasted from 11 AM to 3 PM, so he went to the ER 11/18/2021. -Quality: Pretty sharp, squeezing kind of like a cramp but different. Very pronounced at first.  -Frequency: Usually more often at night; lately it has been more random. No chest pain at this time. -Duration: Previously several hours.  -Associated  symptoms: Back pain. -Aggravating/alleviating factors: Pushing on the regions will help with causing a distraction, but does not actually relieve the pain. Otherwise no alleviating factors. Difficult for him to tell if the pain is exacerbated by deep inspiration.  -Prior cardiac history: None prior to ER visit 10/2021. -Prior ECG: ED 11/16/21 Sinus bradycardia at 49 bpm. -Prior workup: None prior to ER visit 10/2021. -Prior treatment: none -Alcohol: Very little, a couple drinks maybe once a month at most. -Tobacco: Former smoker, he quit about 12-14 years ago. -Comorbidities: Hyperlipidemia -Exercise level:  Enjoys soccer games. Previously trained in the [employer]. Lifts weights 3-4 times a week. Walks 3 times a week. Was able to walk 6 miles on the beach without anginal symptoms. -Cardiac ROS: no shortness of breath, no PND, no orthopnea, no LE edema, no syncope, no palpitations, no lightheadedness, no headaches.  -Family history: His father had stents placed in his mid-60's, was a known heavy drinker.   He occasionally has 5 hour energy drinks on road trips or sometimes before soccer games. He denies any significant palpitations or racing heart rates.  Past Medical History:  Diagnosis Date   Allergic rhinitis    Allergy    Anxiety    Chicken pox    Chronic headaches    Family history of pancreatic cancer 07/29/2018   Gallstones    Hyperlipidemia    Irritable bowel syndrome 07/29/2018   diarrhea    Past  Surgical History:  Procedure Laterality Date   CHOLECYSTECTOMY  May 2021   FOOT SURGERY Right    reattached his plantars nerve   ROTATOR CUFF REPAIR Bilateral     Current Medications: Current Outpatient Medications on File Prior to Visit  Medication Sig   fexofenadine (ALLEGRA) 180 MG tablet Take 180 mg by mouth daily as needed for allergies or rhinitis.   ibuprofen (ADVIL) 200 MG tablet Take 200 mg by mouth every 8 (eight) hours as needed for mild pain or moderate pain.    loperamide (IMODIUM) 2 MG capsule Take 2 mg by mouth as needed for diarrhea or loose stools.   No current facility-administered medications on file prior to visit.     Allergies:   Patient has no known allergies.   Social History   Tobacco Use   Smoking status: Former    Packs/day: 0.50    Types: Cigarettes    Start date: 05/29/1994    Quit date: 05/29/2004    Years since quitting: 17.6   Smokeless tobacco: Never  Vaping Use   Vaping Use: Never used  Substance Use Topics   Alcohol use: Yes    Comment: Very rarely drink   Drug use: No    Family History: family history includes Alcohol abuse in his father, mother, and sister; Cancer in his father; Early death in his mother; Heart disease in his father; Hyperlipidemia in his father; Hypertension in his father; Non-Hodgkin's lymphoma (age of onset: 5) in his paternal uncle; Pancreatic cancer (age of onset: 54) in his mother; Stroke in his maternal grandfather. There is no history of Colon cancer, Prostate cancer, or Esophageal cancer.  ROS:   Please see the history of present illness.  Additional pertinent ROS: Constitutional: Negative for chills, fever, night sweats, unintentional weight loss  HENT: Negative for ear pain and hearing loss.   Eyes: Negative for loss of vision and eye pain.  Respiratory: Negative for cough, sputum, wheezing.   Cardiovascular: See HPI. Gastrointestinal: Negative for abdominal pain, melena, and hematochezia.  Genitourinary: Negative for dysuria and hematuria.  Musculoskeletal: Negative for falls. Positive for back pain.  Skin: Negative for itching and rash.  Neurological: Negative for focal weakness, focal sensory changes and loss of consciousness.  Endo/Heme/Allergies: Does not bruise/bleed easily.     EKGs/Labs/Other Studies Reviewed:    The following studies were reviewed today:  MRI Abdomen  12/06/2021: IMPRESSION: 4.4 cm subcapsular mass arising from the medial upper pole of left kidney,  consistent with papillary renal cell carcinoma.   No evidence of abdominal metastatic disease.   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 This corresponds to the lesion seen on prior chest CTA.   These results will be called to the ordering clinician or representative by the Radiologist Assistant, and communication documented in the PACS or [employer].  CTA Chest  11/16/2021: FINDINGS: Cardiovascular: Satisfactory opacification of the pulmonary arteries to the segmental level. No evidence of pulmonary embolism. The main pulmonary artery is normal in caliber. Normal heart size. No significant pericardial effusion. The thoracic aorta is normal in caliber. No atherosclerotic plaque of the thoracic aorta. No coronary artery calcifications.   Mediastinum/Nodes: No enlarged mediastinal, hilar, or axillary lymph nodes. Thyroid gland, trachea, and esophagus demonstrate no significant findings.   Lungs/Pleura: No focal consolidation. No pulmonary nodule. No pulmonary mass. No pleural effusion. No pneumothorax.   Upper Abdomen: Partially visualized at least 3.9 x 4.4 cm left hypodense renal  lesion with a density of 36 Hounsfield units. There is an ill-defined 3.6 x 3.4 cm right posterior hepatic lobe hypodense lesion with a density of 26 Hounsfield units. Status post cholecystectomy.   Musculoskeletal:   No chest wall abnormality.   No suspicious lytic or blastic osseous lesions. No acute displaced fracture. Multilevel degenerative changes of the spine.   Review of the MIP images confirms the above findings.   IMPRESSION: 1. No pulmonary embolus. 2. No acute intrathoracic abnormality. 3. Partially visualized indeterminate 4.4 cm left renal lesion. When the patient is clinically stable and able to follow directions and hold their breath (preferably as an outpatient) further evaluation with dedicated abdominal MRI renal  should be considered. 4. Indeterminate 3.6 right hepatic lobe hypodensity. This can be further evaluated on the MRI.   EKG:  EKG is personally reviewed.   01/09/2022:  sinus bradycardia at 50 bpm  Recent Labs: 11/16/2021: BUN 18; Creatinine, Ser 1.07; Hemoglobin 13.4; Platelets 200; Potassium 4.2; Sodium 138 11/18/2021: ALT 28   Recent Lipid Panel    Component Value Date/Time   CHOL 176 06/27/2021 1051   TRIG 69.0 06/27/2021 1051   HDL 43.80 06/27/2021 1051   CHOLHDL 4 06/27/2021 1051   VLDL 13.8 06/27/2021 1051   LDLCALC 118 (H) 06/27/2021 1051    Physical Exam:    VS:  BP 110/80 (BP Location: Right Arm, Patient Position: Sitting, Cuff Size: Normal)   Pulse (!) 50   Ht '6\' 1"'$  (1.854 m)   Wt 272 lb (123.4 kg)   SpO2 97%   BMI 35.89 kg/m     Wt Readings from Last 3 Encounters:  01/09/22 272 lb (123.4 kg)  11/18/21 266 lb 6 oz (120.8 kg)  11/16/21 257 lb 0.9 oz (116.6 kg)    GEN: Well nourished, well developed in no acute distress HEENT: Normal, moist mucous membranes NECK: No JVD CARDIAC: regular rhythm, normal S1 and S2, no rubs or gallops. No murmur. VASCULAR: Radial and DP pulses 2+ bilaterally. No carotid bruits RESPIRATORY:  Clear to auscultation without rales, wheezing or rhonchi  ABDOMEN: Soft, non-tender, non-distended MUSCULOSKELETAL:  Ambulates independently SKIN: Warm and dry, no edema NEUROLOGIC:  Alert and oriented x 3. No focal neuro deficits noted. PSYCHIATRIC:  Normal affect    ASSESSMENT:    1. Chest pain, unspecified type   2. Preop cardiovascular exam   3. Cardiac risk counseling   4. Counseling on health promotion and disease prevention    PLAN:    Atypical chest pain -reviewed ER evaluation. hsTn normal -can walk 6 miles on the beach without symptoms -reviewed red flag warning signs that need immediate medical attention  Preoperative cardiovascular risk assessment According to the Revised Cardiac Risk Index (RCRI), he is low risk for  perioperative complications.   The patient is not currently having active cardiac symptoms, and they can achieve >4 METs of activity.  According to ACC/AHA Guidelines, no further testing is needed.  Proceed with surgery at acceptable risk.  Our service is available as needed in the peri-operative period.     Cardiac risk counseling and prevention recommendations: -recommend heart healthy/Mediterranean diet, with whole grains, fruits, vegetable, fish, lean meats, nuts, and olive oil. Limit salt. -recommend moderate walking, 3-5 times/week for 30-50 minutes each session. Aim for at least 150 minutes.week. Goal should be pace of 3 miles/hours, or walking 1.5 miles in 30 minutes -recommend avoidance of tobacco products. Avoid excess alcohol. -ASCVD risk score: The 10-year ASCVD risk score (Arnett DK,  et al., 2019) is: 1.9%   Values used to calculate the score:     Age: 19 years     Sex: Male     Is Non-Hispanic African American: No     Diabetic: No     Tobacco smoker: No     Systolic Blood Pressure: 916 mmHg     Is BP treated: No     HDL Cholesterol: 43.8 mg/dL     Total Cholesterol: 176 mg/dL    Plan for follow up: PRN.  Buford Dresser, MD, PhD, [facility]    Medication Adjustments/Labs and Tests Ordered: Current medicines are reviewed at length with the patient today.  Concerns regarding medicines are outlined above.   Orders Placed This Encounter  Procedures   EKG 12-Lead   No orders of the defined types were placed in this encounter.  Patient Instructions  Medication Instructions:  Your Physician recommend you continue on your current medication as directed.    *If you need a refill on your cardiac medications before your next appointment, please call your pharmacy*   Lab Work: None ordered today   Testing/Procedures: None ordered today   Follow-Up: At [facility], you and your health needs are our priority.  As part of our  continuing mission to provide you with exceptional heart care, we have created designated Provider Care Teams.  These Care Teams include your primary Cardiologist (physician) and Advanced Practice Providers (APPs -  Physician Assistants and Nurse Practitioners) who all work together to provide you with the care you need, when you need it.  We recommend signing up for the patient portal called "MyChart".  Sign up information is provided on this After Visit Summary.  MyChart is used to connect with patients for Virtual Visits (Telemedicine).  Patients are able to view lab/test results, encounter notes, upcoming appointments, etc.  Non-urgent messages can be sent to your provider as well.   To learn more about what you can do with MyChart, go to [email or web address].    Your next appointment:   As needed   The format for your next appointment:   In Person  Provider:   Buford Dresser, MD{           I,Mathew Stumpf,acting as a scribe for Buford Dresser, MD.,have documented all relevant documentation on the behalf of Buford Dresser, MD,as directed by  Buford Dresser, MD while in the presence of Buford Dresser, MD.  I, Buford Dresser, MD, have reviewed all documentation for this visit. The documentation on 02/13/22 for the exam, diagnosis, procedures, and orders are all accurate and complete.   Signed, Buford Dresser, MD PhD 01/09/2022     [place]

## 2022-01-09 NOTE — Patient Instructions (Signed)

## 2022-01-10 DIAGNOSIS — L821 Other seborrheic keratosis: Secondary | ICD-10-CM | POA: Diagnosis not present

## 2022-01-10 DIAGNOSIS — D225 Melanocytic nevi of trunk: Secondary | ICD-10-CM | POA: Diagnosis not present

## 2022-01-10 DIAGNOSIS — L814 Other melanin hyperpigmentation: Secondary | ICD-10-CM | POA: Diagnosis not present

## 2022-01-10 DIAGNOSIS — L578 Other skin changes due to chronic exposure to nonionizing radiation: Secondary | ICD-10-CM | POA: Diagnosis not present

## 2022-02-13 ENCOUNTER — Encounter (HOSPITAL_BASED_OUTPATIENT_CLINIC_OR_DEPARTMENT_OTHER): Payer: Self-pay | Admitting: Cardiology

## 2022-02-14 DIAGNOSIS — D49512 Neoplasm of unspecified behavior of left kidney: Secondary | ICD-10-CM | POA: Diagnosis not present

## 2022-02-15 NOTE — Patient Instructions (Signed)
SURGICAL WAITING ROOM VISITATION Patients having surgery or a procedure may have no more than 2 support people in the waiting area - these visitors may rotate.   Children under the age of 91 must have an adult with them who is not the patient. If the patient needs to stay at the hospital during part of their recovery, the visitor guidelines for inpatient rooms apply. Pre-op nurse will coordinate an appropriate time for 1 support person to accompany patient in pre-op.  This support person may not rotate.    Please refer to the [facility] website for the visitor guidelines for Inpatients (after your surgery is over and you are in a regular room).    Your procedure is scheduled on: 02/28/22   Report to [facility] Main Entrance    Report to admitting at 5:15 AM   Call this number if you have problems the morning of surgery [phone]   Follow a clear liquid diet the day before surgery.   You may have the following liquids until 4:15 AM DAY OF SURGERY  Water Non-Citrus Juices (without pulp, NO RED) Carbonated Beverages Black Coffee (NO MILK/CREAM OR CREAMERS, sugar ok)  Clear Tea (NO MILK/CREAM OR CREAMERS, sugar ok) regular and decaf                             Plain Jell-O (NO RED)                                           Fruit ices (not with fruit pulp, NO RED)                                     Popsicles (NO RED)                                                               Sports drinks like Gatorade (NO RED)                      If you have questions, please contact your surgeon's office.   FOLLOW BOWEL PREP AND ANY ADDITIONAL PRE OP INSTRUCTIONS YOU RECEIVED FROM YOUR SURGEON'S OFFICE!!!     Oral Hygiene is also important to reduce your risk of infection.                                    Remember - BRUSH YOUR TEETH THE MORNING OF SURGERY WITH YOUR REGULAR TOOTHPASTE   Take these medicines the morning of surgery with A SIP OF WATER: None                                You may not have any metal on your body including hair pins, jewelry, and body piercing             Do not wear lotions, powders, cologne, or deodorant  Men may shave face and neck.   Do not bring valuables to the hospital. [place].   Bring small overnight bag day of surgery.   DO NOT [place]. PHARMACY WILL DISPENSE MEDICATIONS LISTED ON YOUR MEDICATION LIST TO YOU DURING YOUR ADMISSION [place]!    Special Instructions: Bring a copy of your healthcare power of attorney and living will documents the day of surgery if you haven't scanned them before.              Please read over the following fact sheets you were given: IF [place] [phone] Kelly    If you received a COVID test during your pre-op visit  it is requested that you wear a mask when out in public, stay away from anyone that may not be feeling well and notify your surgeon if you develop symptoms. If you test positive for Covid or have been in contact with anyone that has tested positive in the last 10 days please notify you surgeon.     [place] - Preparing for Surgery Before surgery, you can play an important role.  Because skin is not sterile, your skin needs to be as free of germs as possible.  You can reduce the number of germs on your skin by washing with CHG (chlorahexidine gluconate) soap before surgery.  CHG is an antiseptic cleaner which kills germs and bonds with the skin to continue killing germs even after washing. Please DO NOT use if you have an allergy to CHG or antibacterial soaps.  If your skin becomes reddened/irritated stop using the CHG and inform your nurse when you arrive at Short Stay. Do not shave (including legs and underarms) for at least 48 hours prior to the first CHG shower.  You may shave your face/neck.  Please follow these  instructions carefully:  1.  Shower with CHG Soap the night before surgery and the  morning of surgery.  2.  If you choose to wash your hair, wash your hair first as usual with your normal  shampoo.  3.  After you shampoo, rinse your hair and body thoroughly to remove the shampoo.                             4.  Use CHG as you would any other liquid soap.  You can apply chg directly to the skin and wash.  Gently with a scrungie or clean washcloth.  5.  Apply the CHG Soap to your body ONLY FROM THE NECK DOWN.   Do   not use on face/ open                           Wound or open sores. Avoid contact with eyes, ears mouth and   genitals (private parts).                       Wash face,  Genitals (private parts) with your normal soap.             6.  Wash thoroughly, paying special attention to the area where your    surgery  will be performed.  7.  Thoroughly rinse your body with warm water  from the neck down.  8.  DO NOT shower/wash with your normal soap after using and rinsing off the CHG Soap.                9.  Pat yourself dry with a clean towel.            10.  Wear clean pajamas.            11.  Place clean sheets on your bed the night of your first shower and do not  sleep with pets. Day of Surgery : Do not apply any lotions/deodorants the morning of surgery.  Please wear clean clothes to the hospital/surgery center.  FAILURE TO FOLLOW THESE INSTRUCTIONS MAY RESULT IN THE CANCELLATION OF YOUR SURGERY  PATIENT SIGNATURE_________________________________  NURSE SIGNATURE__________________________________  ________________________________________________________________________   [name]  An incentive spirometer is a tool that can help keep your lungs clear and active. This tool measures how well you are filling your lungs with each breath. Taking long deep breaths may help reverse or decrease the chance of developing breathing (pulmonary) problems (especially infection)  following: A long period of time when you are unable to move or be active. BEFORE THE PROCEDURE  If the spirometer includes an indicator to show your best effort, your nurse or respiratory therapist will set it to a desired goal. If possible, sit up straight or lean slightly forward. Try not to slouch. Hold the incentive spirometer in an upright position. INSTRUCTIONS FOR USE  Sit on the edge of your bed if possible, or sit up as far as you can in bed or on a chair. Hold the incentive spirometer in an upright position. Breathe out normally. Place the mouthpiece in your mouth and seal your lips tightly around it. Breathe in slowly and as deeply as possible, raising the piston or the ball toward the top of the column. Hold your breath for 3-5 seconds or for as long as possible. Allow the piston or ball to fall to the bottom of the column. Remove the mouthpiece from your mouth and breathe out normally. Rest for a few seconds and repeat Steps 1 through 7 at least 10 times every 1-2 hours when you are awake. Take your time and take a few normal breaths between deep breaths. The spirometer may include an indicator to show your best effort. Use the indicator as a goal to work toward during each repetition. After each set of 10 deep breaths, practice coughing to be sure your lungs are clear. If you have an incision (the cut made at the time of surgery), support your incision when coughing by placing a pillow or rolled up towels firmly against it. Once you are able to get out of bed, walk around indoors and cough well. You may stop using the incentive spirometer when instructed by your caregiver.  RISKS AND COMPLICATIONS Take your time so you do not get dizzy or light-headed. If you are in pain, you may need to take or ask for pain medication before doing incentive spirometry. It is harder to take a deep breath if you are having pain. AFTER USE Rest and breathe slowly and easily. It can be helpful to  keep track of a log of your progress. Your caregiver can provide you with a simple table to help with this. If you are using the spirometer at home, follow these instructions: [place] IF:  You are having difficultly using the spirometer. You have trouble using the spirometer as  often as instructed. Your pain medication is not giving enough relief while using the spirometer. You develop fever of 100.5 F (38.1 C) or higher. SEEK IMMEDIATE MEDICAL CARE IF:  You cough up bloody sputum that had not been present before. You develop fever of 102 F (38.9 C) or greater. You develop worsening pain at or near the incision site. MAKE SURE YOU:  Understand these instructions. Will watch your condition. Will get help right away if you are not doing well or get worse. Document Released: 09/25/2006 Document Revised: 08/07/2011 Document Reviewed: 11/26/2006 [employer] Patient Information 2014 [employer], [place].   ________________________________________________________________________  WHAT IS A BLOOD TRANSFUSION? Blood Transfusion Information  A transfusion is the replacement of blood or some of its parts. Blood is made up of multiple cells which provide different functions. Red blood cells carry oxygen and are used for blood loss replacement. White blood cells fight against infection. Platelets control bleeding. Plasma helps clot blood. Other blood products are available for specialized needs, such as hemophilia or other clotting disorders. BEFORE THE TRANSFUSION  Who gives blood for transfusions?  Healthy volunteers who are fully evaluated to make sure their blood is safe. This is blood bank blood. Transfusion therapy is the safest it has ever been in the practice of medicine. Before blood is taken from a donor, a complete history is taken to make sure that person has no history of diseases nor engages in risky social behavior (examples are intravenous drug use or sexual activity with  multiple partners). The donor's travel history is screened to minimize risk of transmitting infections, such as malaria. The donated blood is tested for signs of infectious diseases, such as HIV and hepatitis. The blood is then tested to be sure it is compatible with you in order to minimize the chance of a transfusion reaction. If you or a relative donates blood, this is often done in anticipation of surgery and is not appropriate for emergency situations. It takes many days to process the donated blood. RISKS AND COMPLICATIONS Although transfusion therapy is very safe and saves many lives, the main dangers of transfusion include:  Getting an infectious disease. Developing a transfusion reaction. This is an allergic reaction to something in the blood you were given. Every precaution is taken to prevent this. The decision to have a blood transfusion has been considered carefully by your caregiver before blood is given. Blood is not given unless the benefits outweigh the risks. AFTER THE TRANSFUSION Right after receiving a blood transfusion, you will usually feel much better and more energetic. This is especially true if your red blood cells have gotten low (anemic). The transfusion raises the level of the red blood cells which carry oxygen, and this usually causes an energy increase. The nurse administering the transfusion will monitor you carefully for complications. HOME CARE INSTRUCTIONS  No special instructions are needed after a transfusion. You may find your energy is better. Speak with your caregiver about any limitations on activity for underlying diseases you may have. SEEK MEDICAL CARE IF:  Your condition is not improving after your transfusion. You develop redness or irritation at the intravenous (IV) site. SEEK IMMEDIATE MEDICAL CARE IF:  Any of the following symptoms occur over the next 12 hours: Shaking chills. You have a temperature by mouth above 102 F (38.9 C), not controlled by  medicine. Chest, back, or muscle pain. People around you feel you are not acting correctly or are confused. Shortness of breath or difficulty breathing. Dizziness and fainting.  You get a rash or develop hives. You have a decrease in urine output. Your urine turns a dark color or changes to pink, red, or brown. Any of the following symptoms occur over the next 10 days: You have a temperature by mouth above 102 F (38.9 C), not controlled by medicine. Shortness of breath. Weakness after normal activity. The white part of the eye turns yellow (jaundice). You have a decrease in the amount of urine or are urinating less often. Your urine turns a dark color or changes to pink, red, or brown. Document Released: 05/12/2000 Document Revised: 08/07/2011 Document Reviewed: 12/30/2007 [facility] Patient Information 2014 [place], [place].  _______________________________________________________________________

## 2022-02-15 NOTE — Progress Notes (Signed)
COVID Vaccine Completed:  Date of COVID positive in last 90 days:  PCP - Inda Coke, PA Cardiologist - Buford Dresser, MD  Cardiac clearance by Buford Dresser 01/09/22 in Epic   Chest x-ray - 11/16/21 Epic EKG - 01/09/22 Epic Stress Test -  ECHO -  Cardiac Cath -  Pacemaker/ICD device last checked: Spinal Cord Stimulator:  Bowel Prep -   Sleep Study -  CPAP -   Fasting Blood Sugar -  Checks Blood Sugar _____ times a day  Blood Thinner Instructions: Aspirin Instructions: Last Dose:  Activity level:  Can go up a flight of stairs and perform activities of daily living without stopping and without symptoms of chest pain or shortness of breath.  Able to exercise without symptoms  Unable to go up a flight of stairs without symptoms of     Anesthesia review: CP  Patient denies shortness of breath, fever, cough and chest pain at PAT appointment  Patient verbalized understanding of instructions that were given to them at the PAT appointment. Patient was also instructed that they will need to review over the PAT instructions again at home before surgery.

## 2022-02-16 ENCOUNTER — Encounter (HOSPITAL_COMMUNITY): Payer: Self-pay

## 2022-02-16 ENCOUNTER — Encounter (HOSPITAL_COMMUNITY)
Admission: RE | Admit: 2022-02-16 | Discharge: 2022-02-16 | Disposition: A | Discharge status: Home / Self Care | Payer: BC Managed Care – PPO | Source: Ambulatory Visit | Attending: Urology | Admitting: Urology

## 2022-02-16 DIAGNOSIS — Z01812 Encounter for preprocedural laboratory examination: Secondary | ICD-10-CM | POA: Diagnosis not present

## 2022-02-16 HISTORY — DX: Gastro-esophageal reflux disease without esophagitis: K21.9

## 2022-02-16 LAB — CBC
HCT: 45.1 % (ref 39.0–52.0)
Hemoglobin: 15 g/dL (ref 13.0–17.0)
MCH: 30.1 pg (ref 26.0–34.0)
MCHC: 33.3 g/dL (ref 30.0–36.0)
MCV: 90.6 fL (ref 80.0–100.0)
Platelets: 204 10*3/uL (ref 150–400)
RBC: 4.98 MIL/uL (ref 4.22–5.81)
RDW: 11.8 % (ref 11.5–15.5)
WBC: 6.8 10*3/uL (ref 4.0–10.5)
nRBC: 0 % (ref 0.0–0.2)

## 2022-02-16 LAB — BASIC METABOLIC PANEL
Anion gap: 5 (ref 5–15)
BUN: 24 mg/dL — ABNORMAL HIGH (ref 6–20)
CO2: 25 mmol/L (ref 22–32)
Calcium: 9.4 mg/dL (ref 8.9–10.3)
Chloride: 108 mmol/L (ref 98–111)
Creatinine, Ser: 0.96 mg/dL (ref 0.61–1.24)
GFR, Estimated: >60 mL/min (ref >60)
Glucose, Bld: 85 mg/dL (ref 70–99)
Potassium: 4 mmol/L (ref 3.5–5.1)
Sodium: 138 mmol/L (ref 135–145)

## 2022-02-16 LAB — TYPE AND SCREEN
ABO/RH(D): A POS
Antibody Screen: NEGATIVE

## 2022-02-20 ENCOUNTER — Encounter: Payer: Self-pay | Admitting: *Deleted

## 2022-02-27 NOTE — Anesthesia Preprocedure Evaluation (Addendum)
Anesthesia Evaluation  Patient identified by MRN, date of birth, ID band Patient awake    Reviewed: Allergy & Precautions, NPO status , Patient's Chart, lab work & pertinent test results  Airway Mallampati: I  TM Distance: >3 FB Neck ROM: Full    Dental no notable dental hx. (+) Teeth Intact, Dental Advisory Given   Pulmonary neg pulmonary ROS, former smoker,    Pulmonary exam normal breath sounds clear to auscultation       Cardiovascular negative cardio ROS Normal cardiovascular exam Rhythm:Regular Rate:Normal     Neuro/Psych  Headaches, PSYCHIATRIC DISORDERS Anxiety    GI/Hepatic Neg liver ROS, GERD  ,  Endo/Other  negative endocrine ROS  Renal/GU negative Renal ROS  negative genitourinary   Musculoskeletal negative musculoskeletal ROS (+)   Abdominal   Peds  Hematology negative hematology ROS (+)   Anesthesia Other Findings   Reproductive/Obstetrics                            Anesthesia Physical Anesthesia Plan  ASA: 2  Anesthesia Plan: General   Post-op Pain Management: Tylenol PO (pre-op)* and Ketamine IV*   Induction: Intravenous  PONV Risk Score and Plan: 2 and Midazolam, Dexamethasone and Ondansetron  Airway Management Planned: Oral ETT  Additional Equipment:   Intra-op Plan:   Post-operative Plan: Extubation in OR  Informed Consent: I have reviewed the patients History and Physical, chart, labs and discussed the procedure including the risks, benefits and alternatives for the proposed anesthesia with the patient or authorized representative who has indicated his/her understanding and acceptance.     Dental advisory given  Plan Discussed with: CRNA  Anesthesia Plan Comments: (2 IVs)        Anesthesia Quick Evaluation

## 2022-02-27 NOTE — H&P (Signed)
PRE-OP H&P  Office Visit Report     02/14/2022   --------------------------------------------------------------------------------   [name]  MRN: [id number]  DOB: [birth year], 46 year old Male  SSN:    PRIMARY CARE:  [name], [place]  REFERRING:  Jose Coke, PA  PROVIDER:  Franchot Kelly, M.D.  TREATING:  [name], [place]  LOCATION:  [facility] [phone]     --------------------------------------------------------------------------------   CC/HPI: Pt presents today for pre-operative history and physical exam in anticipation of robotic assisted lap left partial nephrectomy by Dr. Lovena Kelly on 02/28/22. He is doing well and is without complaint.   Pt denies F/C, HA, CP, SOB, N/V, diarrhea/constipation, back pain, flank pain, hematuria, and dysuria.    HX:   Renal mass   Mr. [name] is a 58 male with a solid and enhancing renal mass measuring 4.4 cm involving the upper pole of the LEFT kidney. The mass was incidentally identified on CT a chest during work-up for chest pain and SOB on 11/16/2021, which was ultimately unremarkable.   -Anatomy: Exophytic left upper pole mass. Single artery and vein. 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 Fairly exophytic. No evident extrarenal involvement, no adenopathy. - 12/16/2021 Anxiety Chronic tension-type  headache, intractable Other irritable bowel syndrome Other seasonal allergic rhinitis    FAMILY HISTORY: Lung Cancer - Father pancreatic cancer - Runs in Family, Mother   SOCIAL HISTORY: Marital Status: Married Ethnicity: Not Hispanic Or Latino; Race: White Current Smoking Status: Patient does not smoke anymore. Has not smoked since 11/26/2008. Smoked for 8 years. Smoked 1/2 pack per day.   Tobacco Use Assessment Completed: Used Tobacco in last 30 days? Does not use smokeless tobacco. Social Drinker.  Does not use drugs. Drinks 3 caffeinated drinks per day. Has not had a blood transfusion.     Notes: ETOH 1 drink per month    REVIEW OF SYSTEMS:    GU Review Male:   Patient denies frequent urination, hard to postpone urination, burning/ pain with urination, get up at night to urinate, leakage of urine, stream starts and stops, trouble starting your stream, have to strain to urinate , erection problems, and penile pain.  Gastrointestinal (Upper):   Patient denies nausea, vomiting, and indigestion/ heartburn.  Gastrointestinal (Lower):   Patient reports diarrhea. Patient denies constipation.  Constitutional:   Patient denies fever, night sweats, weight loss, and fatigue.  Skin:   Patient denies skin rash/ lesion and itching.  Eyes:   Patient denies blurred vision and double vision.  Ears/ Nose/ Throat:   Patient denies sore throat and sinus problems.  Hematologic/Lymphatic:   Patient denies swollen glands and easy bruising.  Cardiovascular:   Patient denies leg swelling and chest pains.  Respiratory:   Patient denies cough and shortness of breath.  Endocrine:   Patient denies excessive thirst.  Musculoskeletal:   Patient denies back pain  and joint pain.  Neurological:   Patient denies headaches and dizziness.  Psychologic:   Patient denies depression and anxiety.   Notes: diarrhea from IBS    VITAL SIGNS:      02/14/2022 02:01 PM  Weight 270 lb / 122.47 kg  Height 73 in / 185.42  cm  BP 131/80 mmHg  Pulse 52 /min  Temperature 97.8 F / 36.5 C  BMI 35.6 kg/m   MULTI-SYSTEM PHYSICAL EXAMINATION:    Constitutional: Well-nourished. No physical deformities. Normally developed. Good grooming.  Neck: Neck symmetrical, not swollen. Normal tracheal position.  Respiratory: Normal breath sounds. No labored breathing, no use of accessory muscles.   Cardiovascular: Regular rate and rhythm. No murmur, no gallop.   Lymphatic: No enlargement of neck, axillae, groin.  Skin: No paleness, no jaundice, no cyanosis. No lesion, no ulcer, no rash.  Neurologic / Psychiatric: Oriented to time, oriented to place, oriented to person. No depression, no anxiety, no agitation.  Gastrointestinal: No mass, no tenderness, no rigidity, non obese abdomen.  Eyes: Normal conjunctivae. Normal eyelids.  Ears, Nose, Mouth, and Throat: Left ear no scars, no lesions, no masses. Right ear no scars, no lesions, no masses. Nose no scars, no lesions, no masses. Normal hearing. Normal lips.  Musculoskeletal: Normal gait and station of head and neck.     Complexity of Data:  Records Review:   Previous Patient Records  Urine Test Review:   Urinalysis   02/14/22  Urinalysis  Urine Appearance Clear   Urine Color Yellow   Urine Glucose Neg mg/dL  Urine Bilirubin Neg mg/dL  Urine Ketones Neg mg/dL  Urine Specific Gravity 1.015   Urine Blood Neg ery/uL  Urine pH 6.5   Urine Protein Neg mg/dL  Urine Urobilinogen 0.2 mg/dL  Urine Nitrites Neg   Urine Leukocyte Esterase Neg leu/uL   CLINICAL DATA: Indeterminate liver and left renal lesions on recent  chest CTA.   EXAM:  MRI ABDOMEN WITHOUT AND WITH CONTRAST   TECHNIQUE:  Multiplanar multisequence MR imaging of the abdomen was performed  both before and after the administration of intravenous contrast.   CONTRAST: 31m MULTIHANCE GADOBENATE DIMEGLUMINE 529 MG/ML IV SOLN   COMPARISON: Chest CTA on 11/16/2021   FINDINGS:  Lower chest: No acute  findings.   Hepatobiliary: 2 adjacent benign hemangiomas are seen in the  posterior right hepatic lobe, largest measuring 3.7 cm. This  corresponds to the lesion seen prior chest CTA. A sub-cm cyst is  also noted in the central right hepatic lobe. No hepatic masses are  identified. Prior cholecystectomy. No evidence of biliary  obstruction.   Pancreas: No mass or inflammatory changes.   Spleen: Within normal limits in size and appearance.   Adrenals/Urinary Tract: Both adrenal glands and the right kidney are  normal in appearance. A subcapsular mass is seen arising from the  medial upper pole of the left kidney which measures 4.4 x 3.8 cm.  This shows mild diffuse contrast enhancement and T2 hypointensity,  consistent with papillary renal cell carcinoma. No other renal  masses are identified. No evidence hydronephrosis.   Stomach/Bowel: Unremarkable.   Vascular/Lymphatic: No pathologically enlarged lymph nodes  identified. No acute vascular findings. No evidence of renal vein or  IVC thrombus.   Other: None.   Musculoskeletal: No suspicious bone lesions identified.   IMPRESSION:  4.4 cm subcapsular mass arising from the medial upper pole of left  kidney, consistent with papillary renal cell carcinoma.   No evidence of abdominal  metastatic disease.   Two adjacent benign hemangiomas in the posterior right hepatic lobe,  largest measuring 3.7 cm. This corresponds to the lesion seen on  prior chest CTA.   These results will be called to the ordering clinician or  representative by the Radiologist Assistant, and communication  documented in the PACS or [employer].    Electronically Signed  By: Marlaine Hind M.D.  On: 12/07/2021 16:43  PROCEDURES:          Urinalysis - 81003 Dipstick Dipstick Cont'd  Color: Yellow Bilirubin: Neg mg/dL  Appearance: Clear Ketones: Neg mg/dL  Specific Gravity: 1.015 Blood: Neg ery/uL  pH: 6.5 Protein: Neg mg/dL  Glucose: Neg mg/dL  Urobilinogen: 0.2 mg/dL    Nitrites: Neg    Leukocyte Esterase: Neg leu/uL    ASSESSMENT:      ICD-10 Details  1 GU:   Left renal neoplasm - D49.512    PLAN:            Medications Stop Meds: Augmentin PO Daily  Discontinue: 02/14/2022  - Reason: The medication cycle was completed.            Schedule Return Visit/Planned Activity: Keep Scheduled Appointment - Schedule Surgery          Document Letter(s):  Created for Patient: Clinical Summary         Notes:   There are no changes in the patients history or physical exam since last evaluation by Dr. Lovena Kelly. Pt is scheduled to undergo left RAL partial nephrectomy on 02/28/22.   All pt's questions were answered to the best of my ability.   -I personally reviewed imaging results and films with the patient. We discussed that the mass in question has features concerning for malignancy. I explained the natural history of presumed renal cell carcinoma. I reviewed the AUA guidelines for evaluation and treatment of the small renal mass. The options of active surveillance, in situ tumor ablation, partial and radical nephrectomy was discussed. The risks of robot-assisted LEFT partial nephrectomy were discussed in detail including but not limited to: negative pathology, open conversion, completion nephrectomy, infection of the urinary tract/skin/abdominal cavity, VTE, MI/CVA, lymphatic leak, injury to adjacent solid/hollow viscus organs, bleeding requiring a blood transfusion, catastrophic bleeding, hernia formation, need for postoperative angioembolization, urinary leak requiring stent/drain, and other imponderables.

## 2022-02-28 ENCOUNTER — Other Ambulatory Visit: Payer: Self-pay

## 2022-02-28 ENCOUNTER — Encounter (HOSPITAL_COMMUNITY)
Admission: RE | Disposition: A | Discharge status: Home / Self Care | Payer: Self-pay | Source: Ambulatory Visit | Attending: Urology

## 2022-02-28 ENCOUNTER — Ambulatory Visit (HOSPITAL_COMMUNITY): Payer: BC Managed Care – PPO | Admitting: Physician Assistant

## 2022-02-28 ENCOUNTER — Encounter (HOSPITAL_COMMUNITY): Payer: Self-pay | Admitting: Urology

## 2022-02-28 ENCOUNTER — Observation Stay (HOSPITAL_COMMUNITY)
Admission: RE | Admit: 2022-02-28 | Discharge: 2022-03-01 | Disposition: A | Discharge status: Home / Self Care | Payer: BC Managed Care – PPO | Source: Ambulatory Visit | Attending: Urology | Admitting: Urology

## 2022-02-28 ENCOUNTER — Ambulatory Visit (HOSPITAL_COMMUNITY): Payer: BC Managed Care – PPO | Admitting: Anesthesiology

## 2022-02-28 DIAGNOSIS — N2889 Other specified disorders of kidney and ureter: Secondary | ICD-10-CM | POA: Diagnosis present

## 2022-02-28 DIAGNOSIS — Z87891 Personal history of nicotine dependence: Secondary | ICD-10-CM | POA: Insufficient documentation

## 2022-02-28 DIAGNOSIS — N289 Disorder of kidney and ureter, unspecified: Secondary | ICD-10-CM | POA: Diagnosis not present

## 2022-02-28 DIAGNOSIS — Z85858 Personal history of malignant neoplasm of other endocrine glands: Secondary | ICD-10-CM | POA: Diagnosis not present

## 2022-02-28 DIAGNOSIS — C642 Malignant neoplasm of left kidney, except renal pelvis: Principal | ICD-10-CM | POA: Insufficient documentation

## 2022-02-28 DIAGNOSIS — D4102 Neoplasm of uncertain behavior of left kidney: Secondary | ICD-10-CM | POA: Diagnosis not present

## 2022-02-28 HISTORY — PX: ROBOTIC ASSITED PARTIAL NEPHRECTOMY: SHX6087

## 2022-02-28 LAB — ABO/RH: ABO/RH(D): A POS

## 2022-02-28 LAB — HEMOGLOBIN AND HEMATOCRIT, BLOOD
HCT: 39.2 % (ref 39.0–52.0)
Hemoglobin: 12.9 g/dL — ABNORMAL LOW (ref 13.0–17.0)

## 2022-02-28 SURGERY — NEPHRECTOMY, PARTIAL, ROBOT-ASSISTED
Anesthesia: General | Laterality: Left

## 2022-02-28 MED ORDER — BUPIVACAINE LIPOSOME 1.3 % IJ SUSP
INTRAMUSCULAR | Status: AC
  Filled 2022-02-28: qty 20

## 2022-02-28 MED ORDER — ROCURONIUM BROMIDE 10 MG/ML (PF) SYRINGE
PREFILLED_SYRINGE | INTRAVENOUS | Status: AC
  Filled 2022-02-28: qty 10

## 2022-02-28 MED ORDER — SODIUM CHLORIDE 0.45 % IV SOLN: INTRAVENOUS | Status: DC

## 2022-02-28 MED ORDER — ALBUMIN HUMAN 5 % IV SOLN
INTRAVENOUS | Status: AC
  Filled 2022-02-28: qty 250

## 2022-02-28 MED ORDER — PROMETHAZINE HCL 12.5 MG PO TABS: 12.5000 mg | ORAL_TABLET | ORAL | 0 refills | Status: DC | PRN

## 2022-02-28 MED ORDER — LACTATED RINGERS IR SOLN
Status: DC | PRN
  Administered 2022-02-28: 1000 mL

## 2022-02-28 MED ORDER — FENTANYL CITRATE PF 50 MCG/ML IJ SOSY: 25.0000 ug | PREFILLED_SYRINGE | INTRAMUSCULAR | Status: DC | PRN

## 2022-02-28 MED ORDER — ONDANSETRON HCL 4 MG/2ML IJ SOLN
4.0000 mg | INTRAMUSCULAR | Status: DC | PRN
  Administered 2022-02-28 (×2): 4 mg via INTRAVENOUS
  Filled 2022-02-28 (×2): qty 2

## 2022-02-28 MED ORDER — DEXAMETHASONE SODIUM PHOSPHATE 10 MG/ML IJ SOLN
INTRAMUSCULAR | Status: DC | PRN
  Administered 2022-02-28: 10 mg via INTRAVENOUS

## 2022-02-28 MED ORDER — SUGAMMADEX SODIUM 500 MG/5ML IV SOLN
INTRAVENOUS | Status: DC | PRN
  Administered 2022-02-28: 300 mg via INTRAVENOUS

## 2022-02-28 MED ORDER — PROPOFOL 10 MG/ML IV BOLUS
INTRAVENOUS | Status: DC | PRN
  Administered 2022-02-28: 150 mg via INTRAVENOUS

## 2022-02-28 MED ORDER — OXYCODONE HCL 5 MG PO TABS
5.0000 mg | ORAL_TABLET | ORAL | Status: DC | PRN
  Administered 2022-02-28 (×2): 5 mg via ORAL
  Filled 2022-02-28 (×2): qty 1

## 2022-02-28 MED ORDER — ROCURONIUM BROMIDE 10 MG/ML (PF) SYRINGE
PREFILLED_SYRINGE | INTRAVENOUS | Status: DC | PRN
  Administered 2022-02-28: 20 mg via INTRAVENOUS
  Administered 2022-02-28 (×3): 10 mg via INTRAVENOUS
  Administered 2022-02-28: 100 mg via INTRAVENOUS
  Administered 2022-02-28: 20 mg via INTRAVENOUS
  Administered 2022-02-28 (×3): 10 mg via INTRAVENOUS

## 2022-02-28 MED ORDER — ACETAMINOPHEN 500 MG PO TABS: 1000.0000 mg | ORAL_TABLET | Freq: Once | ORAL | Status: AC

## 2022-02-28 MED ORDER — SODIUM CHLORIDE (PF) 0.9 % IJ SOLN
INTRAMUSCULAR | Status: AC
  Filled 2022-02-28: qty 20

## 2022-02-28 MED ORDER — MIDAZOLAM HCL 2 MG/2ML IJ SOLN
INTRAMUSCULAR | Status: AC
  Filled 2022-02-28: qty 2

## 2022-02-28 MED ORDER — LACTATED RINGERS IV SOLN
INTRAVENOUS | Status: DC
  Administered 2022-02-28: 1000 mL via INTRAVENOUS

## 2022-02-28 MED ORDER — DIPHENHYDRAMINE HCL 12.5 MG/5ML PO ELIX: 12.5000 mg | ORAL_SOLUTION | Freq: Four times a day (QID) | ORAL | Status: DC | PRN

## 2022-02-28 MED ORDER — MIDAZOLAM HCL 2 MG/2ML IJ SOLN
INTRAMUSCULAR | Status: DC | PRN
  Administered 2022-02-28: 2 mg via INTRAVENOUS

## 2022-02-28 MED ORDER — ORAL CARE MOUTH RINSE: 15.0000 mL | Freq: Once | OROMUCOSAL | Status: AC

## 2022-02-28 MED ORDER — SODIUM CHLORIDE (PF) 0.9 % IJ SOLN
INTRAMUSCULAR | Status: DC | PRN
  Administered 2022-02-28: 20 mL

## 2022-02-28 MED ORDER — ACETAMINOPHEN 500 MG PO TABS: 1000.0000 mg | ORAL_TABLET | Freq: Once | ORAL | Status: DC

## 2022-02-28 MED ORDER — STERILE WATER FOR IRRIGATION IR SOLN
Status: DC | PRN
  Administered 2022-02-28: 1000 mL

## 2022-02-28 MED ORDER — HYDROMORPHONE HCL 1 MG/ML IJ SOLN
INTRAMUSCULAR | Status: DC | PRN
  Administered 2022-02-28 (×3): .5 mg via INTRAVENOUS

## 2022-02-28 MED ORDER — ALBUMIN HUMAN 5 % IV SOLN: INTRAVENOUS | Status: DC | PRN

## 2022-02-28 MED ORDER — HYDROMORPHONE HCL 2 MG/ML IJ SOLN
INTRAMUSCULAR | Status: AC
  Filled 2022-02-28: qty 1

## 2022-02-28 MED ORDER — CEFAZOLIN IN SODIUM CHLORIDE 3-0.9 GM/100ML-% IV SOLN
INTRAVENOUS | Status: AC
  Filled 2022-02-28: qty 100

## 2022-02-28 MED ORDER — FENTANYL CITRATE PF 50 MCG/ML IJ SOSY
PREFILLED_SYRINGE | INTRAMUSCULAR | Status: AC
  Filled 2022-02-28: qty 3

## 2022-02-28 MED ORDER — EPHEDRINE SULFATE-NACL 50-0.9 MG/10ML-% IV SOSY
PREFILLED_SYRINGE | INTRAVENOUS | Status: DC | PRN
  Administered 2022-02-28: 10 mg via INTRAVENOUS

## 2022-02-28 MED ORDER — FENTANYL CITRATE PF 50 MCG/ML IJ SOSY
25.0000 ug | PREFILLED_SYRINGE | INTRAMUSCULAR | Status: DC | PRN
  Administered 2022-02-28 (×2): 50 ug via INTRAVENOUS

## 2022-02-28 MED ORDER — LIDOCAINE 2% (20 MG/ML) 5 ML SYRINGE
INTRAMUSCULAR | Status: DC | PRN
  Administered 2022-02-28: 80 mg via INTRAVENOUS

## 2022-02-28 MED ORDER — HYOSCYAMINE SULFATE 0.125 MG SL SUBL: 0.1250 mg | SUBLINGUAL_TABLET | SUBLINGUAL | Status: DC | PRN

## 2022-02-28 MED ORDER — FENTANYL CITRATE (PF) 250 MCG/5ML IJ SOLN
INTRAMUSCULAR | Status: AC
  Filled 2022-02-28: qty 5

## 2022-02-28 MED ORDER — FENTANYL CITRATE (PF) 250 MCG/5ML IJ SOLN
INTRAMUSCULAR | Status: DC | PRN
  Administered 2022-02-28 (×5): 50 ug via INTRAVENOUS

## 2022-02-28 MED ORDER — ACETAMINOPHEN 500 MG PO TABS
1000.0000 mg | ORAL_TABLET | Freq: Four times a day (QID) | ORAL | Status: AC
  Administered 2022-02-28 – 2022-03-01 (×4): 1000 mg via ORAL
  Filled 2022-02-28 (×4): qty 2

## 2022-02-28 MED ORDER — HEMOSTATIC AGENTS (NO CHARGE) OPTIME
TOPICAL | Status: DC | PRN
  Administered 2022-02-28: 1 via TOPICAL

## 2022-02-28 MED ORDER — HYDROCODONE-ACETAMINOPHEN 5-325 MG PO TABS: 1.0000 | ORAL_TABLET | Freq: Four times a day (QID) | ORAL | 0 refills | Status: DC | PRN

## 2022-02-28 MED ORDER — ONDANSETRON HCL 4 MG/2ML IJ SOLN
INTRAMUSCULAR | Status: AC
  Filled 2022-02-28: qty 2

## 2022-02-28 MED ORDER — DOCUSATE SODIUM 100 MG PO CAPS: 100.0000 mg | ORAL_CAPSULE | Freq: Two times a day (BID) | ORAL | Status: DC

## 2022-02-28 MED ORDER — KETAMINE HCL 10 MG/ML IJ SOLN
INTRAMUSCULAR | Status: AC
  Filled 2022-02-28: qty 1

## 2022-02-28 MED ORDER — CALCIUM CARBONATE ANTACID 500 MG PO CHEW: 2.0000 | CHEWABLE_TABLET | Freq: Every day | ORAL | Status: DC | PRN

## 2022-02-28 MED ORDER — "VISTASEAL 4 ML SINGLE DOSE KIT "
PACK | CUTANEOUS | Status: DC | PRN
  Administered 2022-02-28: 4 mL via TOPICAL

## 2022-02-28 MED ORDER — SUGAMMADEX SODIUM 500 MG/5ML IV SOLN
INTRAVENOUS | Status: AC
  Filled 2022-02-28: qty 5

## 2022-02-28 MED ORDER — ORAL CARE MOUTH RINSE: 15.0000 mL | OROMUCOSAL | Status: DC | PRN

## 2022-02-28 MED ORDER — DOCUSATE SODIUM 100 MG PO CAPS
100.0000 mg | ORAL_CAPSULE | Freq: Two times a day (BID) | ORAL | Status: DC
  Administered 2022-02-28 – 2022-03-01 (×2): 100 mg via ORAL
  Filled 2022-02-28 (×2): qty 1

## 2022-02-28 MED ORDER — DEXAMETHASONE SODIUM PHOSPHATE 10 MG/ML IJ SOLN
INTRAMUSCULAR | Status: AC
  Filled 2022-02-28: qty 1

## 2022-02-28 MED ORDER — "VISTASEAL 4 ML SINGLE DOSE KIT "
4.0000 mL | PACK | Freq: Once | CUTANEOUS | Status: DC
  Filled 2022-02-28: qty 4

## 2022-02-28 MED ORDER — HYDROMORPHONE HCL 1 MG/ML IJ SOLN: 0.5000 mg | INTRAMUSCULAR | Status: DC | PRN

## 2022-02-28 MED ORDER — LACTATED RINGERS IV SOLN: INTRAVENOUS | Status: DC | PRN

## 2022-02-28 MED ORDER — SODIUM CHLORIDE 0.9 % IV SOLN
12.5000 mg | Freq: Four times a day (QID) | INTRAVENOUS | Status: AC | PRN
  Administered 2022-02-28: 12.5 mg via INTRAVENOUS
  Filled 2022-02-28: qty 12.5

## 2022-02-28 MED ORDER — CEFAZOLIN IN SODIUM CHLORIDE 3-0.9 GM/100ML-% IV SOLN
3.0000 g | Freq: Once | INTRAVENOUS | Status: AC
  Administered 2022-02-28: 3 g via INTRAVENOUS

## 2022-02-28 MED ORDER — LIDOCAINE HCL (PF) 2 % IJ SOLN
INTRAMUSCULAR | Status: AC
  Filled 2022-02-28: qty 5

## 2022-02-28 MED ORDER — EPHEDRINE 5 MG/ML INJ
INTRAVENOUS | Status: AC
  Filled 2022-02-28: qty 5

## 2022-02-28 MED ORDER — ACETAMINOPHEN 500 MG PO TABS
ORAL_TABLET | ORAL | Status: AC
  Administered 2022-02-28: 1000 mg via ORAL
  Filled 2022-02-28: qty 2

## 2022-02-28 MED ORDER — KETAMINE HCL 10 MG/ML IJ SOLN
INTRAMUSCULAR | Status: DC | PRN
  Administered 2022-02-28: 50 mg via INTRAVENOUS

## 2022-02-28 MED ORDER — BUPIVACAINE LIPOSOME 1.3 % IJ SUSP
INTRAMUSCULAR | Status: DC | PRN
  Administered 2022-02-28: 40 mL

## 2022-02-28 MED ORDER — TRIPLE ANTIBIOTIC 3.5-400-5000 EX OINT: 1.0000 | TOPICAL_OINTMENT | Freq: Three times a day (TID) | CUTANEOUS | Status: DC | PRN

## 2022-02-28 MED ORDER — DIPHENHYDRAMINE HCL 50 MG/ML IJ SOLN: 12.5000 mg | Freq: Four times a day (QID) | INTRAMUSCULAR | Status: DC | PRN

## 2022-02-28 MED ORDER — CHLORHEXIDINE GLUCONATE 0.12 % MT SOLN
15.0000 mL | Freq: Once | OROMUCOSAL | Status: AC
  Administered 2022-02-28: 15 mL via OROMUCOSAL

## 2022-02-28 MED ORDER — PROPOFOL 10 MG/ML IV BOLUS
INTRAVENOUS | Status: AC
  Filled 2022-02-28: qty 20

## 2022-02-28 MED ORDER — ONDANSETRON HCL 4 MG/2ML IJ SOLN
INTRAMUSCULAR | Status: DC | PRN
  Administered 2022-02-28: 4 mg via INTRAVENOUS

## 2022-02-28 SURGICAL SUPPLY — 76 items
APPLICATOR VISTASEAL 35 (MISCELLANEOUS) ×1 IMPLANT
BAG COUNTER SPONGE SURGICOUNT (BAG) IMPLANT
CHLORAPREP W/TINT 26 (MISCELLANEOUS) ×1 IMPLANT
CLIP LIGATING HEM O LOK PURPLE (MISCELLANEOUS) ×1 IMPLANT
CLIP LIGATING HEMO LOK XL GOLD (MISCELLANEOUS) IMPLANT
CLIP LIGATING HEMO O LOK GREEN (MISCELLANEOUS) ×2 IMPLANT
CLIP SUT LAPRA TY ABSORB (SUTURE) ×2 IMPLANT
COVER SURGICAL LIGHT HANDLE (MISCELLANEOUS) ×1 IMPLANT
COVER TIP SHEARS 8 DVNC (MISCELLANEOUS) ×1 IMPLANT
COVER TIP SHEARS 8MM DA VINCI (MISCELLANEOUS) ×1
CUTTER ECHEON FLEX ENDO 45 340 (ENDOMECHANICALS) IMPLANT
DERMABOND ADVANCED .7 DNX12 (GAUZE/BANDAGES/DRESSINGS) ×1 IMPLANT
DRAIN CHANNEL 15F RND FF 3/16 (WOUND CARE) IMPLANT
DRAPE ARM DVNC X/XI (DISPOSABLE) ×4 IMPLANT
DRAPE COLUMN DVNC XI (DISPOSABLE) ×1 IMPLANT
DRAPE DA VINCI XI ARM (DISPOSABLE) ×4
DRAPE DA VINCI XI COLUMN (DISPOSABLE) ×1
DRAPE INCISE IOBAN 66X45 STRL (DRAPES) ×1 IMPLANT
DRAPE SHEET LG 3/4 BI-LAMINATE (DRAPES) ×1 IMPLANT
DRSG TEGADERM 4X4.75 (GAUZE/BANDAGES/DRESSINGS) IMPLANT
ELECT PENCIL ROCKER SW 15FT (MISCELLANEOUS) ×1 IMPLANT
ELECT REM PT RETURN 15FT ADLT (MISCELLANEOUS) ×1 IMPLANT
EVACUATOR SILICONE 100CC (DRAIN) IMPLANT
GAUZE 4X4 16PLY ~~LOC~~+RFID DBL (SPONGE) IMPLANT
GLOVE BIO SURGEON STRL SZ 6.5 (GLOVE) ×1 IMPLANT
GLOVE BIOGEL PI IND STRL 8 (GLOVE) ×2 IMPLANT
GLOVE SURG LX STRL 7.5 STRW (GLOVE) ×2 IMPLANT
GOWN SRG XL LVL 4 BRTHBL STRL (GOWNS) ×1 IMPLANT
GOWN STRL NON-REIN XL LVL4 (GOWNS) ×1
GOWN STRL REUS W/ TWL XL LVL3 (GOWN DISPOSABLE) ×2 IMPLANT
GOWN STRL REUS W/TWL XL LVL3 (GOWN DISPOSABLE) ×2
HEMOSTAT SURGICEL 4X8 (HEMOSTASIS) ×1 IMPLANT
HOLDER FOLEY CATH W/STRAP (MISCELLANEOUS) ×1 IMPLANT
IRRIG SUCT STRYKERFLOW 2 WTIP (MISCELLANEOUS) ×1
IRRIGATION SUCT STRKRFLW 2 WTP (MISCELLANEOUS) ×1 IMPLANT
KIT BASIN OR (CUSTOM PROCEDURE TRAY) ×1 IMPLANT
KIT TURNOVER KIT A (KITS) IMPLANT
LOOP VESSEL MAXI BLUE (MISCELLANEOUS) IMPLANT
MARKER SKIN DUAL TIP RULER LAB (MISCELLANEOUS) ×1 IMPLANT
NDL INSUFFLATION 14GA 120MM (NEEDLE) ×1 IMPLANT
NEEDLE INSUFFLATION 14GA 120MM (NEEDLE) ×1 IMPLANT
PROTECTOR NERVE ULNAR (MISCELLANEOUS) ×2 IMPLANT
RELOAD STAPLE 45 2.6 WHT THIN (STAPLE) IMPLANT
SCISSORS LAP 5X45 EPIX DISP (ENDOMECHANICALS) ×1 IMPLANT
SEAL CANN UNIV 5-8 DVNC XI (MISCELLANEOUS) ×4 IMPLANT
SEAL XI 5MM-8MM UNIVERSAL (MISCELLANEOUS) ×4
SET TUBE SMOKE EVAC HIGH FLOW (TUBING) ×1 IMPLANT
SOLUTION ELECTROLUBE (MISCELLANEOUS) ×1 IMPLANT
SPIKE FLUID TRANSFER (MISCELLANEOUS) ×1 IMPLANT
STAPLE RELOAD 45 WHT (STAPLE) ×1 IMPLANT
STAPLE RELOAD 45MM WHITE (STAPLE) ×1
SUT ETHILON 2 0 PSLX (SUTURE) IMPLANT
SUT MNCRL AB 4-0 PS2 18 (SUTURE) ×2 IMPLANT
SUT PDS AB 0 CT1 36 (SUTURE) IMPLANT
SUT PROLENE 4 0 P 3 18 (SUTURE) IMPLANT
SUT PROLENE 4 0 RB 1 (SUTURE) ×1
SUT PROLENE 4-0 RB1 .5 CRCL 36 (SUTURE) IMPLANT
SUT V-LOC BARB 180 2/0GR6 GS22 (SUTURE)
SUT VIC AB 1 CT1 36 (SUTURE) ×4 IMPLANT
SUT VIC AB 2-0 SH 27 (SUTURE) ×1
SUT VIC AB 2-0 SH 27X BRD (SUTURE) IMPLANT
SUT VICRYL 0 UR6 27IN ABS (SUTURE) IMPLANT
SUT VLOC 180 3-0 9IN GS21 (SUTURE) IMPLANT
SUT VLOC BARB 180 ABS3/0GR12 (SUTURE) ×2
SUTURE V-LC BRB 180 2/0GR6GS22 (SUTURE) IMPLANT
SUTURE VLOC BRB 180 ABS3/0GR12 (SUTURE) ×2 IMPLANT
SYS BAG RETRIEVAL 10MM (BASKET) ×1
SYSTEM BAG RETRIEVAL 10MM (BASKET) ×1 IMPLANT
TOWEL OR 17X26 10 PK STRL BLUE (TOWEL DISPOSABLE) ×1 IMPLANT
TOWEL OR NON WOVEN STRL DISP B (DISPOSABLE) ×1 IMPLANT
TRAY FOLEY MTR SLVR 16FR STAT (SET/KITS/TRAYS/PACK) ×1 IMPLANT
TRAY LAPAROSCOPIC (CUSTOM PROCEDURE TRAY) ×1 IMPLANT
TROCAR UNIVERSAL OPT 12M 100M (ENDOMECHANICALS) IMPLANT
TROCAR Z THREAD OPTICAL 12X100 (TROCAR) ×1 IMPLANT
TROCAR Z-THREAD OPTICAL 5X100M (TROCAR) IMPLANT
WATER STERILE IRR 1000ML POUR (IV SOLUTION) ×1 IMPLANT

## 2022-02-28 NOTE — Discharge Instructions (Signed)

## 2022-02-28 NOTE — Transfer of Care (Signed)
Immediate Anesthesia Transfer of Care Note  Patient: Verline Lema  Procedure(s) Performed: XI ROBOTIC ASSITED PARTIAL NEPHRECTOMY (Left)  Patient Location: PACU  Anesthesia Type:General  Level of Consciousness: drowsy  Airway & Oxygen Therapy: Patient Spontanous Breathing and Patient connected to face mask oxygen  Post-op Assessment: Report given to RN and Post -op Vital signs reviewed and stable  Post vital signs: Reviewed and stable  Last Vitals:  Vitals Value Taken Time  BP 147/63 02/28/22 1158  Temp    Pulse 77 02/28/22 1200  Resp 11 02/28/22 1200  SpO2 100 % 02/28/22 1200  Vitals shown include unvalidated device data.  Last Pain:  Vitals:   02/28/22 0548  TempSrc: Oral  PainSc:          Complications: No notable events documented.

## 2022-02-28 NOTE — Anesthesia Procedure Notes (Signed)
Procedure Name: Intubation Date/Time: 02/28/2022 7:32 AM  Performed by: Sharlette Dense, CRNAPre-anesthesia Checklist: Patient identified, Emergency Drugs available, Suction available and Patient being monitored Patient Re-evaluated:Patient Re-evaluated prior to induction Oxygen Delivery Method: Circle system utilized Preoxygenation: Pre-oxygenation with 100% oxygen Induction Type: IV induction Ventilation: Mask ventilation without difficulty and Oral airway inserted - appropriate to patient size Laryngoscope Size: Miller and 3 Grade View: Grade I Tube type: Oral Tube size: 8.0 mm Number of attempts: 1 Airway Equipment and Method: Stylet Placement Confirmation: ETT inserted through vocal cords under direct vision, positive ETCO2 and breath sounds checked- equal and bilateral Secured at: 23 cm Tube secured with: Tape Dental Injury: Teeth and Oropharynx as per pre-operative assessment

## 2022-02-28 NOTE — Anesthesia Postprocedure Evaluation (Signed)
Anesthesia Post Note  Patient: [name]  Procedure(s) Performed: XI ROBOTIC ASSITED PARTIAL NEPHRECTOMY (Left)     Patient location during evaluation: PACU Anesthesia Type: General Level of consciousness: awake and alert Pain management: pain level controlled Vital Signs Assessment: post-procedure vital signs reviewed and stable Respiratory status: spontaneous breathing, nonlabored ventilation, respiratory function stable and patient connected to nasal cannula oxygen Cardiovascular status: blood pressure returned to baseline and stable Postop Assessment: no apparent nausea or vomiting Anesthetic complications: no   No notable events documented.  Last Vitals:  Vitals:   02/28/22 1315 02/28/22 1330  BP: (!) 146/73 133/68  Pulse: 76 67  Resp: (!) 7 (!) 6  Temp:    SpO2: 100% 95%    Last Pain:  Vitals:   02/28/22 1330  TempSrc:   PainSc: Asleep                 Antwann Preziosi L Arminda Foglio

## 2022-02-28 NOTE — Op Note (Signed)
Operative Note  Preoperative diagnosis:  1.  4.4 cm left renal mass  Postoperative diagnosis: 1.  4.4 cm left renal mass  Procedure(s): 1.  Robot-assisted laparoscopic left partial nephrectomy 2.  Intraoperative ultrasound of single retroperitoneal organ  Surgeon: Ellison Hughs, MD  Assistants: Debbrah Alar, PA-C An assistant was required for this surgical procedure.  The duties of the assistant included but were not limited to suctioning, passing suture, camera manipulation, retraction.  This procedure would not be able to be performed without an Environmental consultant.   Anesthesia:  General  Complications:  None  EBL: 770 mL  Specimens: 1.  Left renal mass  Drains/Catheters: 1.  Foley catheter 2.  Left lower quadrant JP drain  Intraoperative findings:   Exophytic left posterior upper pole renal mass with grossly negative surgical margins following excision The renorrhaphy was hemostatic at the conclusion of the case  Indication:  [name] is a 46 y.o. male with a solid and enhancing 4.4 cm left upper pole renal mass with features concerning for renal cell carcinoma.  He has been consented for the above procedures, voices understanding and wishes to proceed.  Description of procedure:  After informed consent was obtained, the patient was brought to the operating room and general endotracheal anesthesia was administered.  A 16 French Foley catheter was then sterilely placed and set to gravity drainage.  The patient was then placed in the right lateral decubitus position and prepped and draped in usual sterile fashion.  A timeout was performed.  An 8 mm incision was then made lateral to the left rectus muscle at the level of the left 12th rib.  Abdominal access was obtained via a Veress needle.  The abdominal cavity was then insufflated up to 15 mmHg.  An 8 mm port was then introduced into the abdominal cavity.  Inspection of the port entry site by the robotic camera revealed no  adjacent organ injury.  We then placed 3 additional 8 mm robotic ports to triangulate the left renal hilum.  A 12 mm assistant port was then placed between the carmera port and 3rd robotic arm.  The white line of Toldt along the descending colon was incised sharply and the colon, along with its mesocolonic fat, was reflected medially until the aorta was identified.  We then made a small window adjacent to the lower pole of the left kidney, identifying the left psoas muscle, left ureter and left gonadal vein.  The left ureter and gonadal vein were then reflected anteriorly allowing [place] to then incised the perihilar attachments using electrocautery.  We encountered a small lumbar vein adjacent to the insertion of the left gonadal vein into the left renal vein.  This lumbar vein was ligated with hemo-lock clips in 2 places and incised sharply.  This provided [place] excellent exposure to the left renal hilum.  The perilymphatic tissue surrounding the left renal artery was carefully dissected away, creating a window to place a bulldog clamp later in the procedure.  The anterior portion of Gerota's fascia was incised, allowing reflection the perinephric fat medially and laterally until there was adequate exposure of the upper pole left renal mass.  Intraoperative ultrasound confirmed the heterogenous echogenicity of the lesion compared to the remainder of the renal parenchyma and allowed identification of the depth/borders of the mass, which were demarcated using electrocautery along the renal capsule.  We then exposed the left renal artery and placed a bulldog clamp, marking warm ischemia time.  The left kidney immediately became  ischemic and pale in appearance.  The left renal mass was then sharply excised with grossly negative surgical margins.  Frozen sections were sent came back free of carcinoma.  After the mass was free, it was placed in the left upper quadrant to be retrieved later on during the operation.    The  renorrhaphy was then performed using a 3-0 V-lock in the deep layer of the renal parenchyma.  The bulldog clamp was then removed marking warm ischemia time at 19 minutes.   A series of 1-0 Vicryl sutures with Hem-o-lok clips acting as a buttress were then used to reapproximate the renal capsule.  There did not appear to be any obvious bleeding around the renal hilum nor surrounding our repair.  The incised Gerota's fascia overlying the mass was then reapproximated using a running 2-0 V lock suture.  The mass was then placed in an Endo Catch bag.  Surgicel and Vistaseal were then applied to the resection bed.  The robot was then de-docked and the camera was then reinserted into the assistant port. Laparoscopic graspers were then used to grab the string of the Endo Catch bag, which was brought out through the 12 mm assistant port.  The abdomen was then desufflated and all ports were removed.  The assistant port incision was then extended approximately 1-2 cm and the left renal mass, within the Endo Catch bag, was removed and sent to pathology for permanent section.  The fascia within the assistant port incision was then reapproximated using a 0 Vicryl suture.  The remainder of the incisions were then closed using 4-0 Monocryl and dressed appropriately.  Patient tolerated the procedure well and was transferred to the postanesthesia unit in stable condition.  Plan: Monitor on the floor overnight

## 2022-03-01 ENCOUNTER — Encounter (HOSPITAL_COMMUNITY): Payer: Self-pay | Admitting: Urology

## 2022-03-01 DIAGNOSIS — Z85858 Personal history of malignant neoplasm of other endocrine glands: Secondary | ICD-10-CM | POA: Diagnosis not present

## 2022-03-01 DIAGNOSIS — N289 Disorder of kidney and ureter, unspecified: Secondary | ICD-10-CM | POA: Diagnosis not present

## 2022-03-01 DIAGNOSIS — Z87891 Personal history of nicotine dependence: Secondary | ICD-10-CM | POA: Diagnosis not present

## 2022-03-01 DIAGNOSIS — C642 Malignant neoplasm of left kidney, except renal pelvis: Secondary | ICD-10-CM | POA: Diagnosis not present

## 2022-03-01 LAB — BASIC METABOLIC PANEL
Anion gap: 6 (ref 5–15)
BUN: 16 mg/dL (ref 6–20)
CO2: 27 mmol/L (ref 22–32)
Calcium: 8.5 mg/dL — ABNORMAL LOW (ref 8.9–10.3)
Chloride: 105 mmol/L (ref 98–111)
Creatinine, Ser: 1.26 mg/dL — ABNORMAL HIGH (ref 0.61–1.24)
GFR, Estimated: >60 mL/min (ref >60)
Glucose, Bld: 125 mg/dL — ABNORMAL HIGH (ref 70–99)
Potassium: 4.1 mmol/L (ref 3.5–5.1)
Sodium: 138 mmol/L (ref 135–145)

## 2022-03-01 LAB — HEMOGLOBIN AND HEMATOCRIT, BLOOD
HCT: 35.6 % — ABNORMAL LOW (ref 39.0–52.0)
Hemoglobin: 11.7 g/dL — ABNORMAL LOW (ref 13.0–17.0)

## 2022-03-01 NOTE — Progress Notes (Signed)
Mobility Specialist - Progress Note   03/01/22 1058  Mobility  Activity Ambulated independently in hallway  Activity Response Tolerated well  Distance Ambulated (ft) 500 ft  $Mobility charge 1 Mobility  Level of Assistance Independent  Assistive Device None  HOB Elevated/Bed Position Self regulated  Range of Motion/Exercises Active   Pt was found in recliner chair and agreeable to ambulate. Had no complaints and at EOS returned to recliner chair with all necessities in reach.  Ferd Hibbs Mobility Specialist

## 2022-03-01 NOTE — Progress Notes (Signed)
  Transition of Care [facility]) Screening Note   Patient Details  Name: [name] Date of Birth: 05-21-1976   Transition of Care [facility]) CM/SW Contact:    Dessa Phi, RN Phone Number: 03/01/2022, 2:22 PM    Transition of Care Department [facility]) has reviewed patient and no TOC needs have been identified at this time. We will continue to monitor patient advancement through interdisciplinary progression rounds. If new patient transition needs arise, please place a TOC consult.

## 2022-03-01 NOTE — Progress Notes (Signed)
1 Day Post-Op Subjective: Patient reports feeling better.  Good pain control.  He had nausea with one episode vomiting last night.  Has not eaten anything since that time.  Passing flatus.  Has not used IS yet. Excellent UO.  Expected rise in Cr.  Objective: Vital signs in last 24 hours: Temp:  [97.7 F (36.5 C)-98.1 F (36.7 C)] 98.1 F (36.7 C) (10/04 0743) Pulse Rate:  [63-81] 63 (10/04 0743) Resp:  [6-16] 15 (10/04 0743) BP: (121-155)/(63-90) 134/72 (10/04 0743) SpO2:  [95 %-100 %] 97 % (10/04 0743)  Intake/Output from previous day: 10/03 0701 - 10/04 0700 In: 4941.3 [P.O.:360; I.V.:4231.3; IV Piggyback:350] Out: 3765 [Urine:2935; Drains:60; Blood:770] Intake/Output this shift: No intake/output data recorded.  Physical Exam:  General:alert, cooperative, and no distress Cardiovascular: RRR Lungs: decreased BS bases GI: soft approp tender ND +BS Incisions: C/D/I Urine:clear  Extremities: SCDs in place  Lab Results: Recent Labs    02/28/22 1210 03/01/22 0528  HGB 12.9* 11.7*  HCT 39.2 35.6*   BMET Recent Labs    03/01/22 0528  NA 138  K 4.1  CL 105  CO2 27  GLUCOSE 125*  BUN 16  CREATININE 1.26*  CALCIUM 8.5*   No results for input(s): "LABPT", "INR" in the last 72 hours. No results for input(s): "LABURIN" in the last 72 hours. No results found for this or any previous visit.  Studies/Results: No results found.  Assessment/Plan: 1 Day Post-Op, Procedure(s) (LRB): XI ROBOTIC ASSITED PARTIAL NEPHRECTOMY (Left)  Improving Labs and vitals stable PRN antiemetics Leave diet at clears until certain no additional N/V D/c foley Ambulate, Incentive spirometry DVT prophylaxis Transition to PO pain medications D/C pelvic drain SL IVF   LOS: 0 days   Debbrah Alar 03/01/2022, 8:25 AM

## 2022-03-02 LAB — SURGICAL PATHOLOGY

## 2022-03-02 NOTE — Discharge Summary (Signed)
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 Briefly, [name] is a 46 y.o. year old patient with a solid and enhancing renal mass measuring 4.4 cm involving the upper pole of the LEFT kidney. The mass was incidentally identified on CT a chest during work-up for chest pain and SOB on 11/16/2021, which was ultimately unremarkable.   Hospital Course: Pt was admitted and taken to the OR on 02/28/22 for a left robotic assisted lap partial nephrectomy.  Pt tolerated the procedure well and was hemodynamically stable throughout.  He was extubated without complication and woke up from anesthesia neurologically intact. Post op course progressed as expected.  On POD 1 he was able to ambulate without difficulty.  His foley was removed and he was able to void.  His diet was advanced as tolerated and he was passing flatus.  Vitals and labs remained stable.  He met all discharge criteria and was felt stable for d/c home on POD 1.   Laboratory values:  Recent Labs    02/28/22 1210 03/01/22 0528  HGB 12.9* 11.7*  HCT 39.2 35.6*   Recent Labs    03/01/22 0528  CREATININE 1.26*    Disposition: Home  Discharge instruction: The patient was instructed to be ambulatory but told to refrain from heavy lifting, strenuous activity, or driving.   Discharge medications:  Allergies as of 03/01/2022   No Known Allergies      Medication List     STOP taking these medications    ibuprofen 200 MG tablet Commonly known as: ADVIL       TAKE these medications    calcium carbonate 500 MG chewable tablet Commonly known as: TUMS - dosed in mg elemental calcium Chew 2 tablets by mouth daily as needed for indigestion or heartburn.   docusate sodium 100 MG capsule Commonly  known as: COLACE Take 1 capsule (100 mg total) by mouth 2 (two) times daily.   HYDROcodone-acetaminophen 5-325 MG tablet Commonly known as: Norco Take 1-2 tablets by mouth every 6 (six) hours as needed for moderate pain or severe pain.   loperamide 2 MG capsule Commonly known as: IMODIUM Take 2 mg by mouth as needed for diarrhea or loose stools.   promethazine 12.5 MG tablet Commonly known as: PHENERGAN Take 1 tablet (12.5 mg total) by mouth every 4 (four) hours as needed for nausea or vomiting.        Followup:   Follow-up Information     Winter, Christopher Aaron, MD Follow up on 03/16/2022.   Specialty: Urology Why: at 8:15 Contact information: [address] [place] [address] [phone]                 

## 2022-03-16 DIAGNOSIS — N50812 Left testicular pain: Secondary | ICD-10-CM | POA: Diagnosis not present

## 2022-03-16 DIAGNOSIS — C642 Malignant neoplasm of left kidney, except renal pelvis: Secondary | ICD-10-CM | POA: Diagnosis not present

## 2022-04-18 DIAGNOSIS — D49512 Neoplasm of unspecified behavior of left kidney: Secondary | ICD-10-CM | POA: Diagnosis not present

## 2022-05-11 ENCOUNTER — Encounter: Payer: Self-pay | Admitting: *Deleted

## 2022-05-17 DIAGNOSIS — R1032 Left lower quadrant pain: Secondary | ICD-10-CM | POA: Diagnosis not present

## 2022-05-17 DIAGNOSIS — R1013 Epigastric pain: Secondary | ICD-10-CM | POA: Diagnosis not present

## 2022-06-16 DIAGNOSIS — R1013 Epigastric pain: Secondary | ICD-10-CM | POA: Diagnosis not present

## 2022-06-16 DIAGNOSIS — D49512 Neoplasm of unspecified behavior of left kidney: Secondary | ICD-10-CM | POA: Diagnosis not present

## 2022-06-16 DIAGNOSIS — R1032 Left lower quadrant pain: Secondary | ICD-10-CM | POA: Diagnosis not present

## 2022-06-23 DIAGNOSIS — M79671 Pain in right foot: Secondary | ICD-10-CM | POA: Diagnosis not present

## 2022-06-29 DIAGNOSIS — Z131 Encounter for screening for diabetes mellitus: Secondary | ICD-10-CM | POA: Diagnosis not present

## 2022-06-29 DIAGNOSIS — R635 Abnormal weight gain: Secondary | ICD-10-CM | POA: Diagnosis not present

## 2022-06-29 DIAGNOSIS — E559 Vitamin D deficiency, unspecified: Secondary | ICD-10-CM | POA: Diagnosis not present

## 2022-06-29 DIAGNOSIS — E538 Deficiency of other specified B group vitamins: Secondary | ICD-10-CM | POA: Diagnosis not present

## 2022-06-29 DIAGNOSIS — R5383 Other fatigue: Secondary | ICD-10-CM | POA: Diagnosis not present

## 2022-06-29 DIAGNOSIS — E291 Testicular hypofunction: Secondary | ICD-10-CM | POA: Diagnosis not present

## 2022-06-29 DIAGNOSIS — K580 Irritable bowel syndrome with diarrhea: Secondary | ICD-10-CM | POA: Diagnosis not present

## 2022-07-06 DIAGNOSIS — C642 Malignant neoplasm of left kidney, except renal pelvis: Secondary | ICD-10-CM | POA: Diagnosis not present

## 2022-07-06 LAB — HEPATIC FUNCTION PANEL
ALT: 35 U/L (ref 10–40)
AST: 30 (ref 14–40)
Alkaline Phosphatase: 52 (ref 25–125)
Bilirubin, Total: 0.4

## 2022-07-06 LAB — BASIC METABOLIC PANEL
BUN: 18 (ref 4–21)
CO2: 29 — AB (ref 13–22)
Chloride: 103 (ref 99–108)
Creatinine: 0.8 (ref 0.6–1.3)
Glucose: 95
Potassium: 4.3 meq/L (ref 3.5–5.1)
Sodium: 139 (ref 137–147)

## 2022-07-06 LAB — COMPREHENSIVE METABOLIC PANEL
Albumin: 4.5 (ref 3.5–5.0)
Calcium: 9.3 (ref 8.7–10.7)
eGFR: 105.6

## 2022-07-10 DIAGNOSIS — J984 Other disorders of lung: Secondary | ICD-10-CM | POA: Diagnosis not present

## 2022-07-10 DIAGNOSIS — C642 Malignant neoplasm of left kidney, except renal pelvis: Secondary | ICD-10-CM | POA: Diagnosis not present

## 2022-07-10 DIAGNOSIS — K7689 Other specified diseases of liver: Secondary | ICD-10-CM | POA: Diagnosis not present

## 2022-07-10 DIAGNOSIS — Z9889 Other specified postprocedural states: Secondary | ICD-10-CM | POA: Diagnosis not present

## 2022-07-11 IMAGING — DX DG CHEST 2V
2 series · 2 of 2 positions shown · non-contrast
Comparison: None Available.

CLINICAL DATA: Chest pain.  Former smoker

EXAM:
CHEST - 2 VIEW

[chest pa]
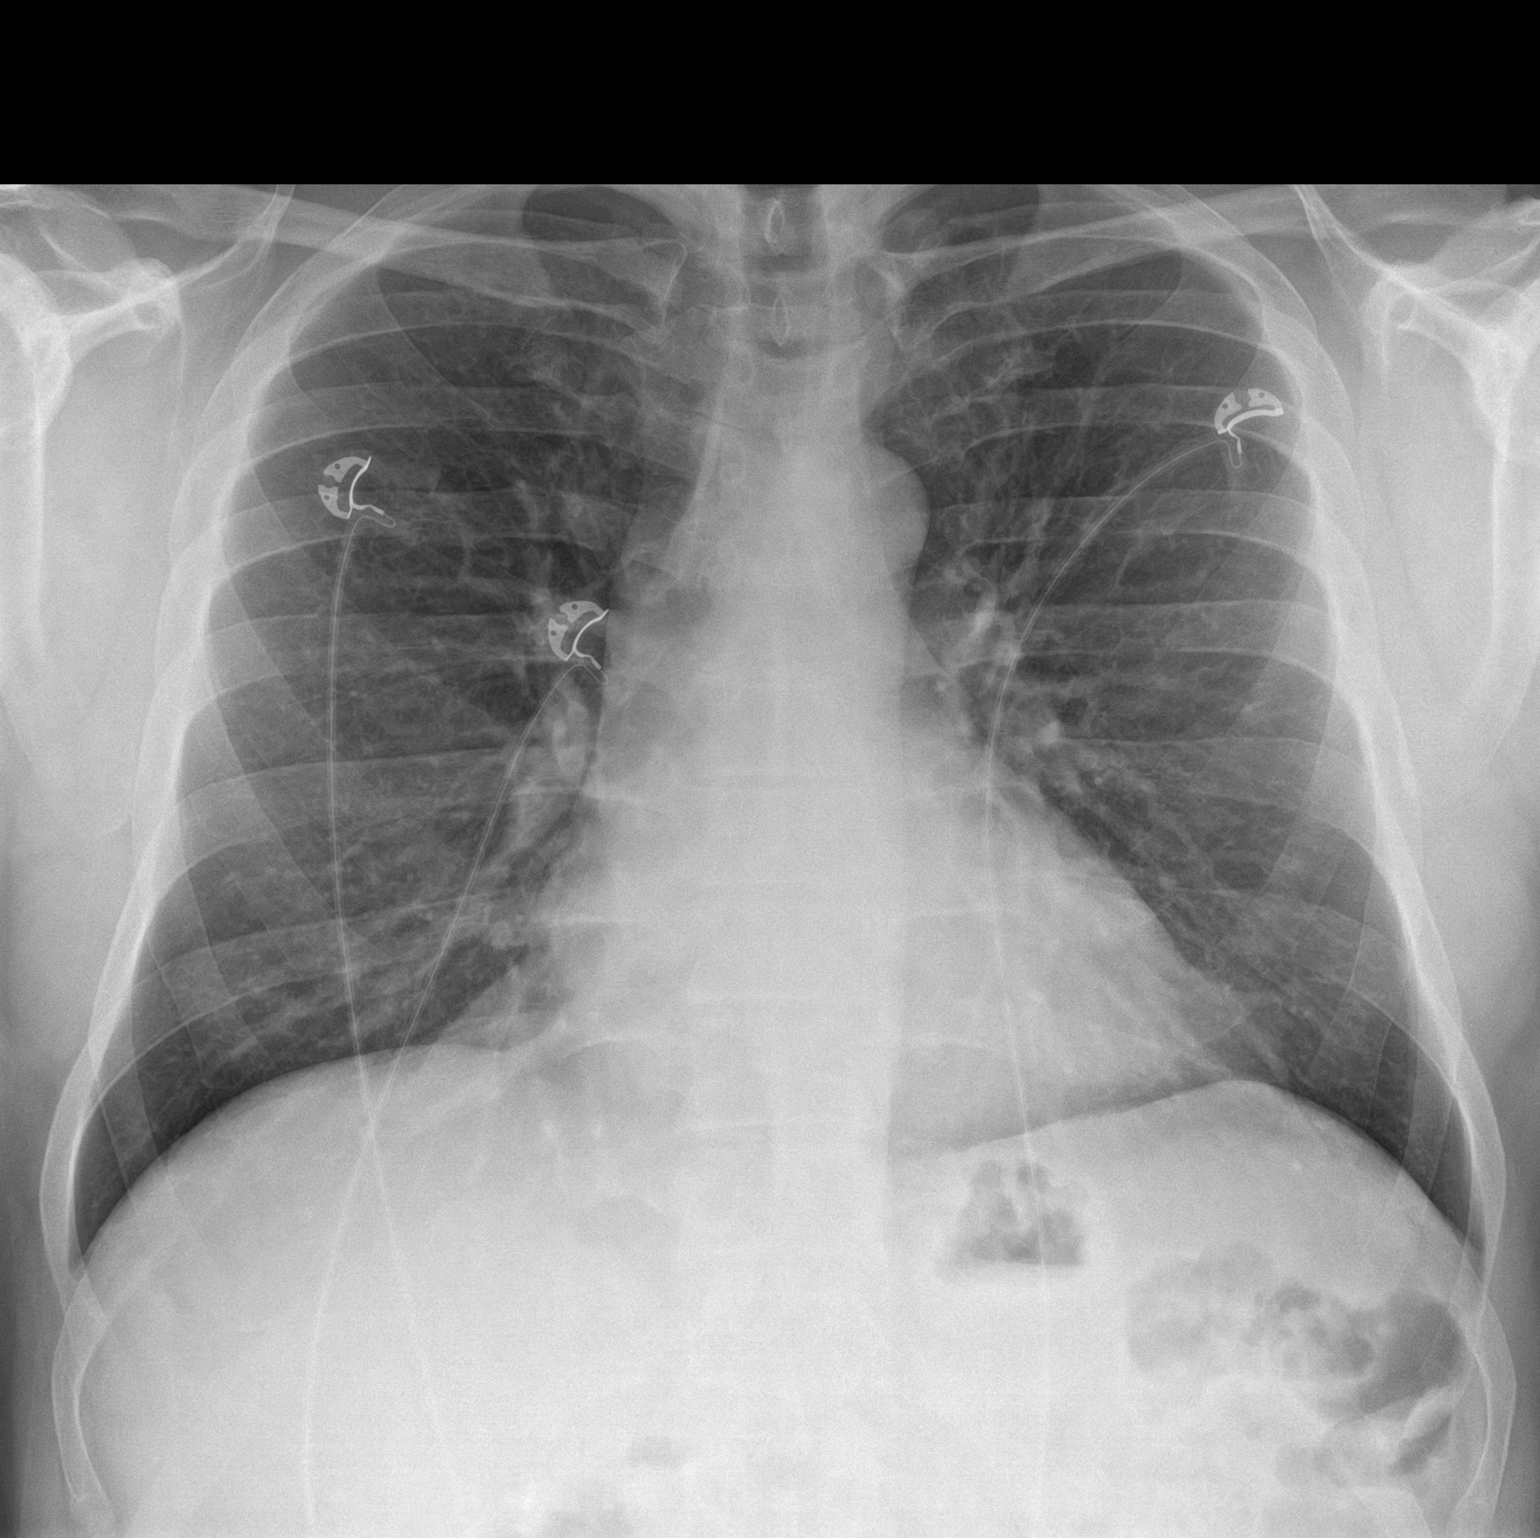

[chest lat]
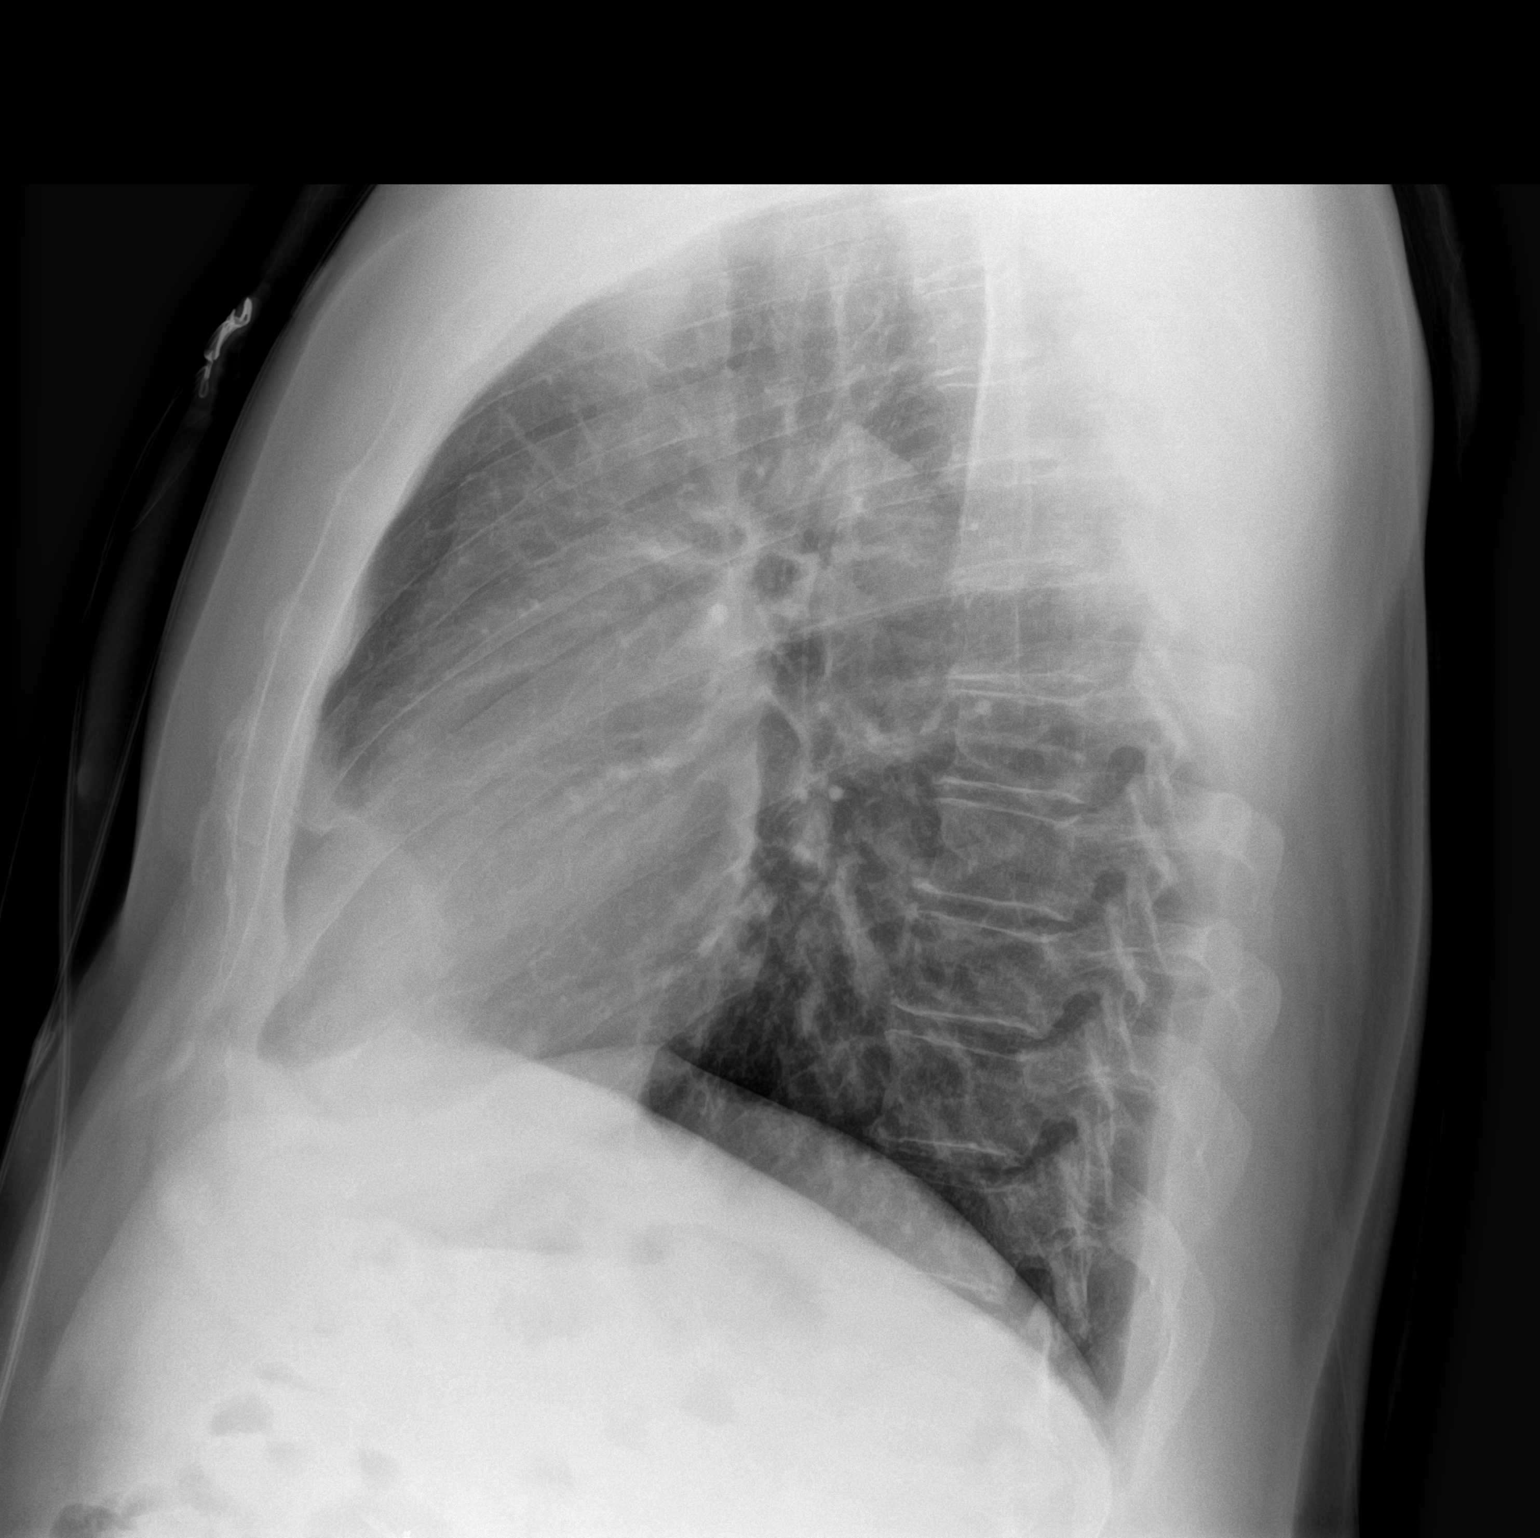

[2 of 2 positions shown; findings below may reference images not displayed]

FINDINGS: Normal mediastinum and cardiac silhouette. Normal pulmonary
vasculature. No evidence of effusion, infiltrate, or pneumothorax.
No acute bony abnormality.
IMPRESSION: No acute cardiopulmonary process.

## 2022-07-13 DIAGNOSIS — C642 Malignant neoplasm of left kidney, except renal pelvis: Secondary | ICD-10-CM | POA: Diagnosis not present

## 2022-07-31 DIAGNOSIS — R5383 Other fatigue: Secondary | ICD-10-CM | POA: Diagnosis not present

## 2022-07-31 DIAGNOSIS — E291 Testicular hypofunction: Secondary | ICD-10-CM | POA: Diagnosis not present

## 2022-07-31 DIAGNOSIS — K580 Irritable bowel syndrome with diarrhea: Secondary | ICD-10-CM | POA: Diagnosis not present

## 2022-07-31 DIAGNOSIS — Z1329 Encounter for screening for other suspected endocrine disorder: Secondary | ICD-10-CM | POA: Diagnosis not present

## 2022-09-20 ENCOUNTER — Telehealth: Payer: Self-pay | Admitting: Physician Assistant

## 2022-09-20 NOTE — Telephone Encounter (Signed)
Patient dropped off document nexus letter for [place] , to be filled out by provider. Patient requested to send it via Call Patient to pick up  . Document is located in providers tray at front office.Please advise at Mobile [phone] (mobile)

## 2022-09-20 NOTE — Telephone Encounter (Signed)
[name], here is the pt that I gave you the form back. Please tell him we have never filled out form for him and we have not seen him since 05/2021, he saw Dr. Ruthine Dose in June 2023.

## 2022-10-02 ENCOUNTER — Ambulatory Visit (INDEPENDENT_AMBULATORY_CARE_PROVIDER_SITE_OTHER): Payer: BC Managed Care – PPO | Admitting: Physician Assistant

## 2022-10-02 ENCOUNTER — Telehealth: Payer: Self-pay | Admitting: Physician Assistant

## 2022-10-02 VITALS — BP 136/80 | HR 47 | Temp 97.3°F | Ht 74.0 in | Wt 275.2 lb

## 2022-10-02 DIAGNOSIS — C642 Malignant neoplasm of left kidney, except renal pelvis: Secondary | ICD-10-CM | POA: Diagnosis not present

## 2022-10-02 DIAGNOSIS — G8929 Other chronic pain: Secondary | ICD-10-CM

## 2022-10-02 DIAGNOSIS — M25511 Pain in right shoulder: Secondary | ICD-10-CM | POA: Diagnosis not present

## 2022-10-02 DIAGNOSIS — E669 Obesity, unspecified: Secondary | ICD-10-CM

## 2022-10-02 DIAGNOSIS — J984 Other disorders of lung: Secondary | ICD-10-CM

## 2022-10-02 NOTE — Telephone Encounter (Signed)
Spoke to [name] told him per Lelon Mast, I reviewed his [facility] paperwork with a provider here that is familiar with the [facility] paperwork   She confirmed that this needs to be completed by Urology who specializes in Renal Cell Carcinoma   Also, I am happy to refer to Pulmonary to review his lung scarring findings on his last CT scan as well I cannot guarantee that they would complete this paperwork but it could be another option. [name] verbalized understanding and said Pulmonary would not do it. Asked him if he has a Insurance underwriter? [name] said yes. Told him would need to contact them to have paperwork completed. [name] verbalized understanding.
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
## 2022-10-02 NOTE — Telephone Encounter (Signed)
Please call patient  I reviewed his [employer] paperwork with a provider here that is familiar with the [facility] paperwork  She confirmed that this needs to be completed by Urology who specializes in Renal Cell Carcinoma  Also, I am happy to refer to Pulmonary to review his lung scarring findings on his last CT scan as well I cannot guarantee that they would complete this paperwork but it could be another option  Jarold Motto

## 2022-10-16 ENCOUNTER — Encounter: Payer: Self-pay | Admitting: Physician Assistant

## 2022-10-16 ENCOUNTER — Ambulatory Visit (INDEPENDENT_AMBULATORY_CARE_PROVIDER_SITE_OTHER): Payer: BC Managed Care – PPO | Admitting: Physician Assistant

## 2022-10-16 VITALS — BP 120/78 | HR 53 | Temp 97.1°F | Ht 74.0 in | Wt 274.0 lb

## 2022-10-16 DIAGNOSIS — C642 Malignant neoplasm of left kidney, except renal pelvis: Secondary | ICD-10-CM | POA: Diagnosis not present

## 2022-10-16 DIAGNOSIS — Z0000 Encounter for general adult medical examination without abnormal findings: Secondary | ICD-10-CM

## 2022-10-16 DIAGNOSIS — J984 Other disorders of lung: Secondary | ICD-10-CM | POA: Diagnosis not present

## 2022-10-16 DIAGNOSIS — E669 Obesity, unspecified: Secondary | ICD-10-CM | POA: Diagnosis not present

## 2022-10-16 LAB — CBC WITH DIFFERENTIAL/PLATELET
Basophils Absolute: 0.1 10*3/uL (ref 0.0–0.1)
Basophils Relative: 0.8 % (ref 0.0–3.0)
Eosinophils Absolute: 0.2 10*3/uL (ref 0.0–0.7)
Eosinophils Relative: 2.9 % (ref 0.0–5.0)
HCT: 41.6 % (ref 39.0–52.0)
Hemoglobin: 14.4 g/dL (ref 13.0–17.0)
Lymphocytes Relative: 28.9 % (ref 12.0–46.0)
Lymphs Abs: 1.9 10*3/uL (ref 0.7–4.0)
MCHC: 34.5 g/dL (ref 30.0–36.0)
MCV: 87.5 fl (ref 78.0–100.0)
Monocytes Absolute: 0.4 10*3/uL (ref 0.1–1.0)
Monocytes Relative: 6.6 % (ref 3.0–12.0)
Neutro Abs: 4 10*3/uL (ref 1.4–7.7)
Neutrophils Relative %: 60.8 % (ref 43.0–77.0)
Platelets: 218 10*3/uL (ref 150.0–400.0)
RBC: 4.76 Mil/uL (ref 4.22–5.81)
RDW: 13 % (ref 11.5–15.5)
WBC: 6.6 10*3/uL (ref 4.0–10.5)

## 2022-10-16 LAB — COMPREHENSIVE METABOLIC PANEL
ALT: 30 U/L (ref 0–53)
AST: 20 U/L (ref 0–37)
Albumin: 4.3 g/dL (ref 3.5–5.2)
Alkaline Phosphatase: 49 U/L (ref 39–117)
BUN: 17 mg/dL (ref 6–23)
CO2: 27 mEq/L (ref 19–32)
Calcium: 9.5 mg/dL (ref 8.4–10.5)
Chloride: 104 mEq/L (ref 96–112)
Creatinine, Ser: 1.12 mg/dL (ref 0.40–1.50)
GFR: 77.76 mL/min (ref >60.00)
Glucose, Bld: 94 mg/dL (ref 70–99)
Potassium: 4.3 mEq/L (ref 3.5–5.1)
Sodium: 140 mEq/L (ref 135–145)
Total Bilirubin: 0.6 mg/dL (ref 0.2–1.2)
Total Protein: 6.9 g/dL (ref 6.0–8.3)

## 2022-10-16 LAB — LIPID PANEL
Cholesterol: 196 mg/dL (ref 0–200)
HDL: 36.7 mg/dL — ABNORMAL LOW (ref >39.00)
LDL Cholesterol: 135 mg/dL — ABNORMAL HIGH (ref 0–99)
NonHDL: 159.64
Total CHOL/HDL Ratio: 5
Triglycerides: 124 mg/dL (ref 0.0–149.0)
VLDL: 24.8 mg/dL (ref 0.0–40.0)

## 2022-10-16 NOTE — Patient Instructions (Addendum)
It was great to see you! ? ?Please go to the lab for blood work.  ? ?Our office will call you with your results unless you have chosen to receive results via MyChart. ? ?If your blood work is normal we will follow-up each year for physicals and as scheduled for chronic medical problems. ? ?If anything is abnormal we will treat accordingly and get you in for a follow-up. ? ?Take care, ? ?Chrisanna Mishra ?  ? ? ?

## 2022-10-16 NOTE — Progress Notes (Signed)
Subjective:    [name] is a 47 y.o. male and is here for a comprehensive physical exam.  HPI  There are no preventive care reminders to display for this patient.  Acute Concerns: None.  Chronic Issues: IBS His IBS is well controlled. He is compliant with 2 mg Imodium.  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 PSA -- N/a Diet -- Healthy eating habits.  Sleep habits -- No concerns Exercise -- Lifting and walking on treadmill, 4-5x a day.   Weight --  Recent weight history Wt Readings from Last 10 Encounters:  10/16/22 274 lb (124.3 kg)  10/02/22 275 lb 4 oz (124.9 kg)  02/28/22 267 lb (121.1 kg)  02/16/22 267 lb (121.1 kg)  01/09/22 272 lb (123.4 kg)  11/18/21 266 lb 6 oz (120.8 kg)  11/16/21 257 lb 0.9 oz (116.6 kg)  08/29/21 257 lb (116.6 kg)  07/14/21 257 lb (116.6 kg)  06/27/21 256 lb (116.1 kg)   Body mass index is 35.18 kg/m.  Mood -- Stable, no concerns Alcohol use --  reports current alcohol use.  Tobacco use --  Tobacco Use: Medium Risk (10/16/2022)   Patient History    Smoking Tobacco Use: Former    Smokeless Tobacco Use: Never    Passive Exposure: Not on file    Eligible for Low Dose CT? No  UTD with eye doctor? No, no new concerns UTD with dentist? Yes     10/02/2022   10:35 AM  Depression screen PHQ 2/9  Decreased Interest 1  Down, Depressed, Hopeless 1  PHQ - 2 Score 2  Altered sleeping 2  Tired, decreased energy 2  Change in appetite 1  Feeling bad or failure about yourself  1  Trouble concentrating 1  Moving slowly or fidgety/restless 0  Suicidal thoughts 0  PHQ-9 Score 9  Difficult doing work/chores Somewhat difficult    Other  providers/specialists: Patient Care Team: Jarold Motto, [place] as PCP - General (Physician Assistant) Jodelle Red, MD as PCP - Cardiology (Cardiology)    PMHx, SurgHx, SocialHx, Medications, and Allergies were reviewed in the Visit Navigator and updated as appropriate.   Past Medical History:  Diagnosis Date   Allergic rhinitis    Allergy    Anxiety    Chicken pox    Chronic headaches    Family history of pancreatic cancer 07/29/2018   Gallstones    GERD (gastroesophageal reflux disease)    Hyperlipidemia    Irritable bowel syndrome 07/29/2018   diarrhea     Past Surgical History:  Procedure Laterality Date   CHOLECYSTECTOMY  May 2021   COLONOSCOPY     FOOT SURGERY Right    reattached his plantars nerve   ROBOTIC ASSITED PARTIAL NEPHRECTOMY Left 02/28/2022   Procedure: XI ROBOTIC ASSITED PARTIAL NEPHRECTOMY;  Surgeon: Rene Paci, MD;  Location: [facility];  Service: Urology;  Laterality: Left;   ROTATOR CUFF REPAIR Bilateral      Family History  Problem Relation Age of Onset   Alcohol abuse Mother    Pancreatic cancer Mother 65   Early death Mother    Heart disease Father    Cancer Father        Lung, heavy smoker  Alcohol abuse Father    Hypertension Father    Hyperlipidemia Father    Alcohol abuse Sister    Stroke Maternal Grandfather    Non-Hodgkin's lymphoma Paternal Uncle 43       linked to agent orange   Colon cancer Neg Hx    Prostate cancer Neg Hx    Esophageal cancer Neg Hx     Social History   Tobacco Use   Smoking status: Former    Packs/day: .5    Types: Cigarettes    Start date: 05/29/1996    Quit date: 05/29/2004    Years since quitting: 18.3   Smokeless tobacco: Never  Vaping Use   Vaping Use: Never used  Substance Use Topics   Alcohol use: Yes    Comment: very rare   Drug use: No    Review of Systems:   ROS  Objective:    Vitals:   10/16/22 0800  BP: 120/78  Pulse: (!) 53  Temp: (!) 97.1 F (36.2  C)  SpO2: 98%    Body mass index is 35.18 kg/m.  General  Alert, cooperative, no distress, appears stated age  Head:  Normocephalic, without obvious abnormality, atraumatic  Eyes:  PERRL, conjunctiva/corneas clear, EOM's intact, fundi benign, both eyes       Ears:  Normal TM's and external ear canals, both ears  Nose: Nares normal, septum midline, mucosa normal, no drainage or sinus tenderness  Throat: Lips, mucosa, and tongue normal; teeth and gums normal  Neck: Supple, symmetrical, trachea midline, no adenopathy;     thyroid:  No enlargement/tenderness/nodules; no carotid bruit or JVD  Back:   Symmetric, no curvature, ROM normal, no CVA tenderness  Lungs:   Clear to auscultation bilaterally, respirations unlabored  Chest wall:  No tenderness or deformity  Heart:  Regular rate and rhythm, S1 and S2 normal, no murmur, rub or gallop  Abdomen:   Soft, non-tender, bowel sounds active all four quadrants, no masses, no organomegaly  Extremities: Extremities normal, atraumatic, no cyanosis or edema  Prostate : deferred   Skin: Skin color, texture, turgor normal, no rashes or lesions  Lymph nodes: Cervical, supraclavicular, and axillary nodes normal  Neurologic: CNII-XII grossly intact. Normal strength, sensation and reflexes throughout   AssessmentPlan:   Routine physical examination Today patient counseled on age appropriate routine health concerns for screening and prevention, each reviewed and up to date or declined. Immunizations reviewed and up to date or declined. Labs ordered and reviewed. Risk factors for depression reviewed and negative. Hearing function and visual acuity are intact. ADLs screened and addressed as needed. Functional ability and level of safety reviewed and appropriate. Education, counseling and referrals performed based on assessed risks today. Patient provided with a copy of personalized plan for preventive services.  Renal cell cancer, left (HCC) No red  flags Last urology note reviewed Continue follow-up with urology  Apical lung scarring Referral to pulm  Obesity, unspecified classification, unspecified obesity type, unspecified whether serious comorbidity present Continue healthy lifestyle efforts   I,Rachel Rivera,acting as a scribe for [employer], PA.,have documented all relevant documentation on the behalf of Jarold Motto, PA,as directed by  Jarold Motto, PA while in the presence of Jarold Motto, [place].  I, Jarold Motto, [place], have reviewed all documentation for this visit. The documentation on 10/16/22 for the exam, diagnosis, procedures, and orders are all accurate and complete.    Jarold Motto, PA-C [facility]

## 2022-11-23 ENCOUNTER — Ambulatory Visit: Payer: BC Managed Care – PPO | Admitting: Pulmonary Disease

## 2022-11-23 ENCOUNTER — Encounter: Payer: Self-pay | Admitting: Pulmonary Disease

## 2022-11-23 VITALS — BP 114/68 | HR 56 | Temp 97.9°F | Ht 72.5 in | Wt 277.8 lb

## 2022-11-23 DIAGNOSIS — R0609 Other forms of dyspnea: Secondary | ICD-10-CM | POA: Diagnosis not present

## 2022-11-23 NOTE — Progress Notes (Signed)
@Patient  ID: [name], male    DOB: 06/22/1975, 47 y.o.   MRN: [id number]  Chief Complaint  Patient presents with   Consult    SOB with exertion.  Sx increasing over last 10 years.    Referring provider: Jarold Motto, PA  HPI:   47 y.o. man whom we are seeing for evaluation of abnormal chest imaging.  Note from PCP reviewed.  Patient had a CT scan of his chest CTA PE protocol 10/2021.  On my review interpretation reveals clear lungs bilaterally, no significant findings or signs of scarring etc.  He had a repeat CT abdomen pelvis chest with contrast 06/2022 for urology.  For urologic surveillance of other issue.  On my review interpretation of lungs this is largely unchanged with linear atelectasis noted in the right lower lobe, otherwise clear lungs.  The radiology read, some mild apical pleural parenchymal scarring.  I see no evidence of this on previous scans nor most recent scan.  Reviewed scan in detail with patient images etc.  He does have some worsening dyspnea exertion over time.  Prior to his most recent tour in [place] he would run sometimes of the 10 miles a time just for being bored.  Shortly after return from [place] (many years ago) he felt ready to mouth difficult.  Over time cardiovascular exercise and endurance is decreased.  To the point regular anymore.  Does weightlifting.  Gets his heart rate up about the same endurance exercise etc.     Questionaires / Pulmonary Flowsheets:   ACT:      No data to display          MMRC:     No data to display          Epworth:      No data to display          Tests:   FENO:  No results found for: "NITRICOXIDE"  PFT:     No data to display          WALK:      No data to display          Imaging: Personally reviewed and as per EMR discussion this note No results found.  Lab Results: Personally reviewed CBC    Component Value Date/Time   WBC 6.6 10/16/2022 0823   RBC 4.76 10/16/2022  0823   HGB 14.4 10/16/2022 0823   HCT 41.6 10/16/2022 0823   PLT 218.0 10/16/2022 0823   MCV 87.5 10/16/2022 0823   MCH 30.1 02/16/2022 0910   MCHC 34.5 10/16/2022 0823   RDW 13.0 10/16/2022 0823   LYMPHSABS 1.9 10/16/2022 0823   MONOABS 0.4 10/16/2022 0823   EOSABS 0.2 10/16/2022 0823   BASOSABS 0.1 10/16/2022 0823    BMET    Component Value Date/Time   NA 140 10/16/2022 0823   K 4.3 10/16/2022 0823   CL 104 10/16/2022 0823   CO2 27 10/16/2022 0823   GLUCOSE 94 10/16/2022 0823   BUN 17 10/16/2022 0823   CREATININE 1.12 10/16/2022 0823   CALCIUM 9.5 10/16/2022 0823   GFRNONAA >60 03/01/2022 0528   GFRAA >60 09/02/2019 1843    BNP No results found for: "BNP"  ProBNP No results found for: "PROBNP"  Specialty Problems   None   No Known Allergies  Immunization History  Administered Date(s) Administered   Influenza Whole 03/27/2018   Tdap 12/18/2011, 06/06/2016    Past Medical History:  Diagnosis Date   Allergic rhinitis  Allergy    Anxiety    Chicken pox    Chronic headaches    Family history of pancreatic cancer 07/29/2018   Gallstones    GERD (gastroesophageal reflux disease)    Hyperlipidemia    Irritable bowel syndrome 07/29/2018   diarrhea    Tobacco History: Social History   Tobacco Use  Smoking Status Former   Packs/day: .5   Types: Cigarettes   Start date: 05/29/1996   Quit date: 05/29/2004   Years since quitting: 18.4  Smokeless Tobacco Never   Counseling given: Not Answered   Continue to not smoke  Outpatient Encounter Medications as of 11/23/2022  Medication Sig   acetaminophen (TYLENOL) 500 MG tablet Take 1,000 mg by mouth every 6 (six) hours as needed.   calcium carbonate (TUMS - DOSED IN MG ELEMENTAL CALCIUM) 500 MG chewable tablet Chew 2 tablets by mouth daily as needed for indigestion or heartburn.   fexofenadine (ALLEGRA) 180 MG tablet Take 180 mg by mouth daily.   ibuprofen (ADVIL) 200 MG tablet Take 400-600 mg by mouth as  needed for headache.   loperamide (IMODIUM) 2 MG capsule Take 2 mg by mouth as needed for diarrhea or loose stools.   Turmeric 400 MG CAPS Take 1 capsule by mouth as needed.   No facility-administered encounter medications on file as of 11/23/2022.     Review of Systems  Review of Systems  No chest pain exertion pain orthopnea or PND.  Comprehensive review of systems otherwise negative. Physical Exam  BP 114/68 (BP Location: Left Arm, Patient Position: Sitting, Cuff Size: Large)   Pulse (!) 56   Temp 97.9 F (36.6 C) (Oral)   Ht 6' 0.5" (1.842 m)   Wt 277 lb 12.8 oz (126 kg)   SpO2 97%   BMI 37.16 kg/m   Wt Readings from Last 5 Encounters:  11/23/22 277 lb 12.8 oz (126 kg)  10/16/22 274 lb (124.3 kg)  10/02/22 275 lb 4 oz (124.9 kg)  02/28/22 267 lb (121.1 kg)  02/16/22 267 lb (121.1 kg)    BMI Readings from Last 5 Encounters:  11/23/22 37.16 kg/m  10/16/22 35.18 kg/m  10/02/22 35.34 kg/m  02/28/22 34.28 kg/m  02/16/22 34.28 kg/m     Physical Exam General: Sitting in chair, no acute distress Eyes: EOMI, icterus Neck: Supple, no JVP  pulmonary: Clear, normal work of breathing Cardiovascular: Warm, no edema Abdomen: Nondistended bowel sounds present MSK: No synovitis, no joint effusion Neuro: Normal gait, no weakness Psych: Normal mood, full affect   Assessment & Plan:   Abnormal CT scan: Reviewed serial CT scans and see no significant finding.  If anything may be some mild pleuroparenchymal scarring at the apex which is not unusual.  Nothing to be concerned about.  No evidence of ILD.  Discussed in detail with patient.  Dyspnea on exertion: With significant exercising.  Possibly related to deconditioning over time or weight gain.  Chest imaging is clear which is quite reassuring.  Exercise-induced asthma possible.  Declined further workup at this time.  Consider PFTs in the future.   No follow-ups on file.   Karren Burly,  MD 11/23/2022   This appointment required 60 minutes of patient care (this includes precharting, chart review, review of results, face-to-face care, etc.).

## 2023-01-27 ENCOUNTER — Encounter: Payer: Self-pay | Admitting: Pulmonary Disease

## 2023-04-30 DIAGNOSIS — E291 Testicular hypofunction: Secondary | ICD-10-CM | POA: Diagnosis not present

## 2023-04-30 DIAGNOSIS — R5383 Other fatigue: Secondary | ICD-10-CM | POA: Diagnosis not present

## 2023-04-30 DIAGNOSIS — R635 Abnormal weight gain: Secondary | ICD-10-CM | POA: Diagnosis not present

## 2023-04-30 DIAGNOSIS — E559 Vitamin D deficiency, unspecified: Secondary | ICD-10-CM | POA: Diagnosis not present

## 2023-04-30 DIAGNOSIS — D519 Vitamin B12 deficiency anemia, unspecified: Secondary | ICD-10-CM | POA: Diagnosis not present

## 2023-04-30 DIAGNOSIS — K580 Irritable bowel syndrome with diarrhea: Secondary | ICD-10-CM | POA: Diagnosis not present

## 2023-05-30 HISTORY — PX: PARTIAL NEPHRECTOMY: SHX414

## 2023-06-06 LAB — BASIC METABOLIC PANEL
BUN: 17 (ref 4–21)
CO2: 31 — AB (ref 13–22)
Chloride: 99 (ref 99–108)
Creatinine: 1 (ref 0.6–1.3)
Glucose: 91
Potassium: 4.3 meq/L (ref 3.5–5.1)
Sodium: 140 (ref 137–147)

## 2023-06-06 LAB — HEPATIC FUNCTION PANEL
ALT: 28 U/L (ref 10–40)
AST: 28 (ref 14–40)
Alkaline Phosphatase: 45 (ref 25–125)
Bilirubin, Total: 0.6

## 2023-06-06 LAB — COMPREHENSIVE METABOLIC PANEL
Albumin: 4.2 (ref 3.5–5.0)
Calcium: 9.4 (ref 8.7–10.7)
eGFR: 88

## 2023-06-24 ENCOUNTER — Emergency Department (HOSPITAL_BASED_OUTPATIENT_CLINIC_OR_DEPARTMENT_OTHER)
Admission: EM | Admit: 2023-06-24 | Discharge: 2023-06-24 | Disposition: A | Discharge status: Home / Self Care | Payer: BC Managed Care – PPO | Attending: Emergency Medicine | Admitting: Emergency Medicine

## 2023-06-24 DIAGNOSIS — R519 Headache, unspecified: Secondary | ICD-10-CM | POA: Insufficient documentation

## 2023-06-24 DIAGNOSIS — R110 Nausea: Secondary | ICD-10-CM | POA: Diagnosis not present

## 2023-06-24 MED ORDER — SODIUM CHLORIDE 0.9 % IV BOLUS
1000.0000 mL | Freq: Once | INTRAVENOUS | Status: AC
  Administered 2023-06-24: 1000 mL via INTRAVENOUS

## 2023-06-24 MED ORDER — KETOROLAC TROMETHAMINE 30 MG/ML IJ SOLN
15.0000 mg | Freq: Once | INTRAMUSCULAR | Status: AC
  Administered 2023-06-24: 15 mg via INTRAVENOUS
  Filled 2023-06-24: qty 1

## 2023-06-24 MED ORDER — DIPHENHYDRAMINE HCL 50 MG/ML IJ SOLN
12.5000 mg | Freq: Once | INTRAMUSCULAR | Status: AC
  Administered 2023-06-24: 12.5 mg via INTRAVENOUS
  Filled 2023-06-24: qty 1

## 2023-06-24 MED ORDER — PROCHLORPERAZINE EDISYLATE 10 MG/2ML IJ SOLN
10.0000 mg | Freq: Once | INTRAMUSCULAR | Status: AC
  Administered 2023-06-24: 10 mg via INTRAVENOUS
  Filled 2023-06-24: qty 2

## 2023-06-24 NOTE — ED Triage Notes (Addendum)
Pt reports headache that began last night and has been unresolved by advil OTC and feels nauseous from the pain today.  Pt reports congestion earlier this week with no known fever that has been resolving.  Attempting to eat this am made nausea worse.  +photosensitivity.  Pt denies headaches of similar nature in the past.  Pt also reports noticing small amount of blood in his urine yesterday.  Pt h/o kidney CA surgery Oct 2023.

## 2023-06-24 NOTE — ED Provider Notes (Signed)
[facility] AT [facility] Provider Note   CSN: [id number] Arrival date & time: 06/24/23  1153     History  Chief Complaint  Patient presents with   Headache    [name] is a 48 y.o. male.  With a history of hyperlipidemia, chronic headaches, GERD, anxiety, IBS presenting to the ED for evaluation of a headache.  Headache began yesterday evening and has progressively worsened.  Reports a history of tension type headaches that are typically improved well with Advil.  He has taken Advil twice with minimal improvement in his symptoms.  States he woke up this morning and the headache was worse.  He has also had nausea, photosensitivity and phono sensitivity.  Headache is localized to the right side of the head and described as a throbbing sensation.  He denies any fevers, neck stiffness, vomiting, vision changes, numbness, weakness, tingling.   Headache      Home Medications Prior to Admission medications   Medication Sig Start Date End Date Taking? Authorizing Provider  acetaminophen (TYLENOL) 500 MG tablet Take 1,000 mg by mouth every 6 (six) hours as needed.    [provider]  calcium carbonate (TUMS - DOSED IN MG ELEMENTAL CALCIUM) 500 MG chewable tablet Chew 2 tablets by mouth daily as needed for indigestion or heartburn.    [provider]  fexofenadine (ALLEGRA) 180 MG tablet Take 180 mg by mouth daily. 09/18/19   [provider]  ibuprofen (ADVIL) 200 MG tablet Take 400-600 mg by mouth as needed for headache. 09/18/19   [provider]  loperamide (IMODIUM) 2 MG capsule Take 2 mg by mouth as needed for diarrhea or loose stools.    [provider]  Turmeric 400 MG CAPS Take 1 capsule by mouth as needed. 09/18/19   [provider]      Allergies    Patient has no known allergies.    Review of Systems   Review of Systems  Neurological:  Positive for headaches.  All other systems reviewed  and are negative.   Physical Exam Updated Vital Signs BP (!) 148/73 (BP Location: Right Arm)   Pulse (!) 55   Temp 98 F (36.7 C)   Resp 18   SpO2 98%  Physical Exam Vitals and nursing note reviewed.  Constitutional:      General: He is not in acute distress.    Appearance: Normal appearance. He is normal weight. He is not ill-appearing.     Comments: Resting comfortably in bed  HENT:     Head: Normocephalic and atraumatic.  Pulmonary:     Effort: Pulmonary effort is normal. No respiratory distress.  Abdominal:     General: Abdomen is flat.  Musculoskeletal:        General: Normal range of motion.     Cervical back: Neck supple.  Skin:    General: Skin is warm and dry.  Neurological:     Mental Status: He is alert and oriented to person, place, and time.     Comments:   MENTAL STATUS: AAOx3   LANG/SPEECH: Fluent, intact naming, repetition & comprehension   CRANIAL NERVES:   II: Pupils equal and reactive   III, IV, VI: EOM intact, no gaze preference or deviation, no nystagmus   V: normal sensation of the face   VII: no facial asymmetry   VIII: normal hearing to speech   MOTOR: 5/5 in both upper and lower extremities   SENSORY: Normal to touch in  all extremiteis   COORD: Normal finger to nose, heel to shin and shoulder shrug, no tremor, no dysmetria. No pronator drift   Psychiatric:        Mood and Affect: Mood normal.        Behavior: Behavior normal.     ED Results / Procedures / Treatments   Labs (all labs ordered are listed, but only abnormal results are displayed) Labs Reviewed - No data to display  EKG None  Radiology No results found.  Procedures Procedures    Medications Ordered in ED Medications  ketorolac (TORADOL) 30 MG/ML injection 15 mg (15 mg Intravenous Given 06/24/23 1623)  sodium chloride 0.9 % bolus 1,000 mL (0 mLs Intravenous Stopped 06/24/23 1710)  prochlorperazine (COMPAZINE) injection 10 mg (10 mg Intravenous Given 06/24/23 1623)   diphenhydrAMINE (BENADRYL) injection 12.5 mg (12.5 mg Intravenous Given 06/24/23 1623)    ED Course/ Medical Decision Making/ A&P                                 Medical Decision Making Risk Prescription drug management.  This patient presents to the ED for concern of headache, this involves an extensive number of treatment options, and is a complaint that carries with it a high risk of complications and morbidity. Emergent considerations for headache include subarachnoid hemorrhage, meningitis, temporal arteritis, glaucoma, cerebral ischemia, carotid/vertebral dissection, intracranial tumor, Venous sinus thrombosis, carbon monoxide poisoning, acute or chronic subdural hemorrhage.  Other considerations include: Migraine, Cluster headache, Hypertension, Caffeine, alcohol, or drug withdrawal, Pseudotumor cerebri, Arteriovenous malformation, Head injury, Neurocysticercosis, Post-lumbar puncture, Preeclampsia, Tension headache, Sinusitis, Cervical arthritis, Refractive error causing strain, Dental abscess, Otitis media, Temporomandibular joint syndrome, Depression, Somatoform disorder (eg, somatization) Trigeminal neuralgia, Glossopharyngeal neuralgia.   My initial workup includes symptom control  Additional history obtained from: Nursing notes from this visit. Family wife at bedside provides portion of history  Afebrile, bradycardic but at his baseline.  Otherwise hemodynamically stable.  48 year old male presenting to the ED for evaluation of headache.  Localized to the right side of the head.  No thunderclap component.  No meningeal signs.  Symptoms began last night.  Suspect migraine headache.  He was treated in the emergency department and reported significant improvement in his symptoms.  Overall low suspicion for [facility], ICH, meningitis, or other emergent intracranial abnormalities as the cause of his headache.  He was encouraged to follow-up with his primary care provider.  He was given return  precautions.  Stable at discharge.  At this time there does not appear to be any evidence of an acute emergency medical condition and the patient appears stable for discharge with appropriate outpatient follow up. Diagnosis was discussed with patient who verbalizes understanding of care plan and is agreeable to discharge. I have discussed return precautions with patient who verbalizes understanding. Patient encouraged to follow-up with their PCP within 1 week. All questions answered.  Note: Portions of this report may have been transcribed using voice recognition software. Every effort was made to ensure accuracy; however, inadvertent computerized transcription errors may still be present.         Final Clinical Impression(s) / ED Diagnoses Final diagnoses:  Bad headache    Rx / DC Orders ED Discharge Orders     None         Michelle Piper, Cordelia Poche 06/24/23 1717    Tegeler, Canary Brim, MD 06/25/23 0930

## 2023-06-24 NOTE — Discharge Instructions (Signed)
You have been seen today for your complaint of headache. Home care instructions are as follows:  Drink plenty of water Follow up with: Your primary care provider in 1 week for reevaluation Please seek immediate medical care if you develop any of the following symptoms: Your migraine headache becomes severe or lasts more than 72 hours. You have a fever or stiff neck. You have vision loss. Your muscles feel weak or like you cannot control them. You lose your balance often or have trouble walking. You faint. You have a seizure. At this time there does not appear to be the presence of an emergent medical condition, however there is always the potential for conditions to change. Please read and follow the below instructions.  Do not take your medicine if  develop an itchy rash, swelling in your mouth or lips, or difficulty breathing; call 911 and seek immediate emergency medical attention if this occurs.  You may review your lab tests and imaging results in their entirety on your MyChart account.  Please discuss all results of fully with your primary care provider and other specialist at your follow-up visit.  Note: Portions of this text may have been transcribed using voice recognition software. Every effort was made to ensure accuracy; however, inadvertent computerized transcription errors may still be present.

## 2023-06-26 DIAGNOSIS — C642 Malignant neoplasm of left kidney, except renal pelvis: Secondary | ICD-10-CM | POA: Diagnosis not present

## 2023-07-04 ENCOUNTER — Telehealth: Payer: Self-pay

## 2023-07-04 NOTE — Progress Notes (Signed)
 Transition Care Management Unsuccessful Follow-up Telephone Call  Date of discharge and from where:  [facility] 1/26  Attempts:  1st Attempt  Reason for unsuccessful TCM follow-up call:  No answer/busy   Jon Colt [facility] Guide, Phone: [phone] Fax: [phone] Website: [email or web address]

## 2023-07-10 DIAGNOSIS — R161 Splenomegaly, not elsewhere classified: Secondary | ICD-10-CM | POA: Diagnosis not present

## 2023-07-10 DIAGNOSIS — R918 Other nonspecific abnormal finding of lung field: Secondary | ICD-10-CM | POA: Diagnosis not present

## 2023-07-10 DIAGNOSIS — Z905 Acquired absence of kidney: Secondary | ICD-10-CM | POA: Diagnosis not present

## 2023-07-10 DIAGNOSIS — D49512 Neoplasm of unspecified behavior of left kidney: Secondary | ICD-10-CM | POA: Diagnosis not present

## 2023-07-10 DIAGNOSIS — D1803 Hemangioma of intra-abdominal structures: Secondary | ICD-10-CM | POA: Diagnosis not present

## 2023-07-26 DIAGNOSIS — C642 Malignant neoplasm of left kidney, except renal pelvis: Secondary | ICD-10-CM | POA: Diagnosis not present

## 2023-07-30 ENCOUNTER — Encounter: Payer: Self-pay | Admitting: Physician Assistant

## 2024-01-14 DIAGNOSIS — L814 Other melanin hyperpigmentation: Secondary | ICD-10-CM | POA: Diagnosis not present

## 2024-01-14 DIAGNOSIS — D225 Melanocytic nevi of trunk: Secondary | ICD-10-CM | POA: Diagnosis not present

## 2024-01-14 DIAGNOSIS — L578 Other skin changes due to chronic exposure to nonionizing radiation: Secondary | ICD-10-CM | POA: Diagnosis not present

## 2024-01-14 DIAGNOSIS — L821 Other seborrheic keratosis: Secondary | ICD-10-CM | POA: Diagnosis not present

## 2024-01-17 ENCOUNTER — Ambulatory Visit: Admitting: Gastroenterology

## 2024-01-17 ENCOUNTER — Encounter: Payer: Self-pay | Admitting: Gastroenterology

## 2024-01-17 VITALS — BP 130/60 | HR 79 | Ht 72.0 in | Wt 276.4 lb

## 2024-01-17 DIAGNOSIS — R140 Abdominal distension (gaseous): Secondary | ICD-10-CM

## 2024-01-17 DIAGNOSIS — M6208 Separation of muscle (nontraumatic), other site: Secondary | ICD-10-CM

## 2024-01-17 DIAGNOSIS — R1012 Left upper quadrant pain: Secondary | ICD-10-CM

## 2024-01-17 DIAGNOSIS — R194 Change in bowel habit: Secondary | ICD-10-CM | POA: Diagnosis not present

## 2024-01-17 DIAGNOSIS — K582 Mixed irritable bowel syndrome: Secondary | ICD-10-CM

## 2024-01-17 NOTE — Patient Instructions (Addendum)
 Gas/Bloat Recommend Low fodmap diet OTC IBgard capsules 2 capsules with meals OTC Citrucel 1 tsp po daily  May consider OTC Miralax po daily as needed for constipation  Diastasis recti Recommend you discuss with PCP or surgeon about being referred for physical therapy    _______________________________________________________  If your blood pressure at your visit was 140/90 or greater, please contact your primary care physician to follow up on this.  _______________________________________________________  If you are age 7 or older, your body mass index should be between 23-30. Your Body mass index is 37.48 kg/m. If this is out of the aforementioned range listed, please consider follow up with your Primary Care Provider.  If you are age 49 or younger, your body mass index should be between 19-25. Your Body mass index is 37.48 kg/m. If this is out of the aformentioned range listed, please consider follow up with your Primary Care Provider.   ________________________________________________________  The [facility] providers would like to encourage you to use MYCHART to communicate with providers for non-urgent requests or questions.  Due to long hold times on the telephone, sending your provider a message by [facility] may be a faster and more efficient way to get a response.  Please allow 48 business hours for a response.  Please remember that this is for non-urgent requests.  _______________________________________________________  [facility] is using a team-based approach to care.  Your team is made up of your doctor and two to three APPS. Our APPS (Nurse Practitioners and Physician Assistants) work with your physician to ensure care continuity for you. They are fully qualified to address your health concerns and develop a treatment plan. They communicate directly with your gastroenterologist to care for you. Seeing the Advanced Practice Practitioners on your physician's team  can help you by facilitating care more promptly, often allowing for earlier appointments, access to diagnostic testing, procedures, and other specialty referrals.   Thank you for trusting me with your gastrointestinal care. Deanna May, FNP-C

## 2024-01-17 NOTE — Progress Notes (Addendum)
 Chief Complaint: abd pain Primary GI Doctor:Dr. Federico  HPI:  Patient is a 48  year old male patient with past medical history of GERD, hyperlipidemia, IBS, anxiety, who presents for follow-up for a evaluation of abd pain .   Patient lats seen in GI office by Delon, PA on 07/14/21 for diarrhea and need for screening for colorectal cancer.   Interval History    Patient presents with main complaint and bloating as well as wanted to discuss abdominal discomfort he has had since his partial left nephrectomy in 10/23.  Patient reports the discomfort is right where the surgical incision is and hurts even when he is just sitting.  Patient also notes when he does ab exercises he notices a bulge in the middle of his abdomen he would like to have it evaluated today.  Patient does workout several days a week and includes ab exercises.  Patient was seen by urology at the beginning of the year and completed CT scan which he showed me on his phone today that revealed a small hiatal hernia.  He asked if this could be causing some of the symptoms.     Patient reports he has a bowel movement approximately every 1 to 2 days.  Patient has history of diarrhea prior to surgery but states recently he has had more issues with constipation.  No blood in stool.  Patient does travel a lot therefore he does eat on the go about 50% of the time and other 50% he eats at home.  Patient denies you drinking carbonated drinks or using straws.    Wt Readings from Last 3 Encounters:  01/17/24 276 lb 6 oz (125.4 kg)  11/23/22 277 lb 12.8 oz (126 kg)  10/16/22 274 lb (124.3 kg)    Past Medical History:  Diagnosis Date   Allergic rhinitis    Allergy    Anxiety    Chicken pox    Chronic headaches    Family history of pancreatic cancer 07/29/2018   Gallstones    GERD (gastroesophageal reflux disease)    Hyperlipidemia    Irritable bowel syndrome 07/29/2018   diarrhea    Past Surgical History:  Procedure Laterality Date    CHOLECYSTECTOMY  Adelyn Roscher 2021   COLONOSCOPY     FOOT SURGERY Right    reattached his plantars nerve   PARTIAL NEPHRECTOMY  2025   ROBOTIC ASSITED PARTIAL NEPHRECTOMY Left 02/28/2022   Procedure: XI ROBOTIC ASSITED PARTIAL NEPHRECTOMY;  Surgeon: Devere Lonni Righter, MD;  Location: [facility];  Service: Urology;  Laterality: Left;   ROTATOR CUFF REPAIR Bilateral     Current Outpatient Medications  Medication Sig Dispense Refill   acetaminophen  (TYLENOL ) 500 MG tablet Take 1,000 mg by mouth every 6 (six) hours as needed.     calcium  carbonate (TUMS - DOSED IN MG ELEMENTAL CALCIUM ) 500 MG chewable tablet Chew 2 tablets by mouth daily as needed for indigestion or heartburn.     fexofenadine (ALLEGRA) 180 MG tablet Take 180 mg by mouth daily.     ibuprofen (ADVIL) 200 MG tablet Take 400-600 mg by mouth as needed for headache.     loperamide (IMODIUM) 2 MG capsule Take 2 mg by mouth as needed for diarrhea or loose stools.     Turmeric 400 MG CAPS Take 1 capsule by mouth as needed.     No current facility-administered medications for this visit.    Allergies as of 01/17/2024   (No Known Allergies)    Family  History  Problem Relation Age of Onset   Alcohol abuse Mother    Pancreatic cancer Mother 59   Early death Mother    Heart disease Father    Cancer Father        Lung, heavy smoker   Alcohol abuse Father    Hypertension Father    Hyperlipidemia Father    Alcohol abuse Sister    Stroke Maternal Grandfather    Non-Hodgkin's lymphoma Paternal Uncle 53       linked to agent orange   Colon cancer Neg Hx    Prostate cancer Neg Hx    Esophageal cancer Neg Hx     Review of Systems:    Constitutional: No weight loss, fever, chills, weakness or fatigue HEENT: Eyes: No change in vision               Ears, Nose, Throat:  No change in hearing or congestion Skin: No rash or itching Cardiovascular: No chest pain, chest pressure or palpitations   Respiratory: No SOB or  cough Gastrointestinal: See HPI and otherwise negative Genitourinary: No dysuria or change in urinary frequency Neurological: No headache, dizziness or syncope Musculoskeletal: No new muscle or joint pain Hematologic: No bleeding or bruising Psychiatric: No history of depression or anxiety    Physical Exam:  Vital signs: BP 130/60 (Cuff Size: Large)   Pulse 79   Ht 6' (1.829 m)   Wt 276 lb 6 oz (125.4 kg)   BMI 37.48 kg/m   Constitutional:   Pleasant  male appears to be in NAD, Well developed, Well nourished, alert and cooperative Throat: Oral cavity and pharynx without inflammation, swelling or lesion.  Respiratory: Respirations even and unlabored. Lungs clear to auscultation bilaterally.   No wheezes, crackles, or rhonchi.  Cardiovascular: Normal S1, S2. Regular rate and rhythm. No peripheral edema, cyanosis or pallor.  Gastrointestinal:  Soft, nondistended, nontender. No rebound or guarding. Hypoactive bowel sounds. Diastasis recti noted. Rectal:  Not performed.  Msk:  Symmetrical without gross deformities. Without edema, no deformity or joint abnormality.  Neurologic:  Alert and  oriented x4;  grossly normal neurologically.  Skin:   Dry and intact without significant lesions or rashes.  RELEVANT LABS AND IMAGING: CBC    Latest Ref Rng & Units 10/16/2022    8:23 AM 03/01/2022    5:28 AM 02/28/2022   12:10 PM  CBC  WBC 4.0 - 10.5 K/uL 6.6     Hemoglobin 13.0 - 17.0 g/dL 85.5  88.2  87.0   Hematocrit 39.0 - 52.0 % 41.6  35.6  39.2   Platelets 150.0 - 400.0 K/uL 218.0        CMP     Latest Ref Rng & Units 06/06/2023   12:00 AM 10/16/2022    8:23 AM 07/06/2022   12:00 AM  CMP  Glucose 70 - 99 mg/dL  94    BUN 4 - 21 17     17  18       Creatinine 0.6 - 1.3 1.0     1.12  0.8      Sodium 137 - 147 140     140  139      Potassium 3.5 - 5.1 mEq/L 4.3     4.3  4.3      Chloride 99 - 108 99     104  103      CO2 13 - 22 31     27   29  Calcium  8.7 - 10.7 9.4     9.5  9.3       Total Protein 6.0 - 8.3 g/dL  6.9    Total Bilirubin 0.2 - 1.2 mg/dL  0.6    Alkaline Phos 25 - 125 45     49  52      AST 14 - 40 28     20  30       ALT 10 - 40 U/L 28     30  35         This result is from an external source.    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   No evidence of abdominal metastatic disease.   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 This corresponds to the lesion seen on prior chest CTA.   These results will be called to the ordering clinician or representative by the Radiologist Assistant, and communication documented in the PACS or [employer]. 08/2021 colonoscopy - The examined portion of the ileum was normal. - One 8 mm polyp in the ascending colon, removed with a cold snare. Resected and retrieved. - One 10 mm polyp in the sigmoid colon, removed with a cold snare. Resected and retrieved. - Non- bleeding internal hemorrhoids. Path: 1. Surgical [P], colon nos, random sites BENIGN COLONIC MUCOSA WITH NO DIAGNOSTIC ABNORMALITY 2. Surgical [P], colon, ascending, polyp (1) SESSILE SERRATED POLYP WITHOUT CYTOLOGIC DYSPLASIA 3. Surgical [P], colon, sigmoid, polyp (1) HYPERPLASTIC POLYP NEGATIVE FOR DYSPLASIA AND CARCINOMA  Assessment: Encounter Diagnoses  Name Primary?   Bloating Yes   Left upper quadrant abdominal pain    Altered bowel habits    Diastasis recti    Irritable bowel syndrome with both constipation and diarrhea     48 year old male patient with history of partial nephrectomy in October 2023 who presents with abdominal discomfort and discomfort.  Patient does report his bowels are not consistent and deals with issues with constipation.  Will try to regulate his bowels adding fiber supplementation  and Otc Miralax prn. Also recommend low FODMAP diet to see if this helps with  some of his bloating along with OTC ibgard.  Upon examination patient did have diastasis recti which I explained to him would be something that he would need to follow-up with PCP on and typically managed with physical therapy.  Patient did show me CT scan from urology that was done in the last 6 months which was unremarkable.  He Doran Nestle be able to try Salonpas or topical lidocaine  to the MSK area that is causing discomfort.  Patient also up-to-date on colonoscopy with recall in April 2028.   Plan: -Recommend Low fodmap diet -OTC ibgard capsules 2 with meals -OTC Citrucel 1 tsp po daily  -Turhan Chill consider OTC Miralax po daily as needed -request CT scan from [facility]  -recommend he discuss with PCP or surgeon treatment options for Diastasis recti inclduing physical therapy.  Addendum: Obtained CT chest and abdomen 07/10/2023 Impression unchanged postoperative appearance of the left kidney status post partial superior pole nephrectomy with adjacent fat necrosis.  No suspicious soft tissue or contrast enhancement to suggest local recurrence. No evidence of left lymphadenopathy or metastatic disease in the chest or abdomen Unchanged mild splenomegaly Mild diffuse bilateral bronchial wall thickening consistent with nonspecific infectious or inflammatory bronchitis Aortic arthrosclerosis  Thank you for the courtesy of  this consult. Please call me with any questions or concerns.   Brylan Seubert, FNP-C  Gastroenterology 01/17/2024, 4:47 PM  Cc: Job Lukes, [place]

## 2024-01-21 NOTE — Progress Notes (Signed)
 I agree with the assessment and plan as outlined by Ms. May.

## 2024-02-21 DIAGNOSIS — Z0001 Encounter for general adult medical examination with abnormal findings: Secondary | ICD-10-CM | POA: Diagnosis not present

## 2024-02-21 DIAGNOSIS — K580 Irritable bowel syndrome with diarrhea: Secondary | ICD-10-CM | POA: Diagnosis not present

## 2024-02-21 DIAGNOSIS — R5383 Other fatigue: Secondary | ICD-10-CM | POA: Diagnosis not present

## 2024-02-21 DIAGNOSIS — E291 Testicular hypofunction: Secondary | ICD-10-CM | POA: Diagnosis not present
